# Patient Record
Sex: Male | Born: 1960
Health system: Southern US, Community
[De-identification: ages and names within clinical notes are randomized; demographics above are authoritative.]

## PROBLEM LIST (undated history)

## (undated) DIAGNOSIS — Z9889 Other specified postprocedural states: Secondary | ICD-10-CM

## (undated) DIAGNOSIS — J449 Chronic obstructive pulmonary disease, unspecified: Secondary | ICD-10-CM

## (undated) DIAGNOSIS — I10 Essential (primary) hypertension: Secondary | ICD-10-CM

## (undated) DIAGNOSIS — E785 Hyperlipidemia, unspecified: Secondary | ICD-10-CM

## (undated) DIAGNOSIS — E119 Type 2 diabetes mellitus without complications: Secondary | ICD-10-CM

## (undated) DIAGNOSIS — G4733 Obstructive sleep apnea (adult) (pediatric): Secondary | ICD-10-CM

## (undated) HISTORY — PX: NECK SURGERY: SHX720

## (undated) HISTORY — PX: BACK SURGERY: SHX140

## (undated) HISTORY — DX: Other specified postprocedural states: Z98.890

## (undated) HISTORY — DX: Hyperlipidemia, unspecified: E78.5

## (undated) HISTORY — DX: Obstructive sleep apnea (adult) (pediatric): G47.33

## (undated) HISTORY — PX: NASAL SINUS SURGERY: SHX719

## (undated) SURGICAL SUPPLY — 9 items
CATH IMPULSE 5F ANG/FL3.5 (CATHETERS) ×1
DEVICE RAD COMP TR BAND LRG (VASCULAR PRODUCTS) ×1
GLIDESHEATH SLEND SS 6F .021 (SHEATH) ×2 IMPLANT
INQWIRE 1.5J .035X260CM (WIRE) ×2
KIT HEART LEFT (KITS) ×2 IMPLANT
PACK CARDIAC CATHETERIZATION (CUSTOM PROCEDURE TRAY) ×1
SYR MEDRAD MARK V 150ML (SYRINGE) ×1
TRANSDUCER W/STOPCOCK (MISCELLANEOUS) ×1
TUBING CIL FLEX 10 FLL-RA (TUBING) ×1

---

## 2010-04-26 DIAGNOSIS — K2270 Barrett's esophagus without dysplasia: Secondary | ICD-10-CM

## 2010-04-26 DIAGNOSIS — E109 Type 1 diabetes mellitus without complications: Secondary | ICD-10-CM

## 2010-04-26 DIAGNOSIS — K219 Gastro-esophageal reflux disease without esophagitis: Secondary | ICD-10-CM

## 2010-05-25 DIAGNOSIS — K219 Gastro-esophageal reflux disease without esophagitis: Secondary | ICD-10-CM

## 2010-05-25 DIAGNOSIS — K644 Residual hemorrhoidal skin tags: Secondary | ICD-10-CM

## 2010-05-25 DIAGNOSIS — Z79899 Other long term (current) drug therapy: Secondary | ICD-10-CM | POA: Insufficient documentation

## 2010-05-25 DIAGNOSIS — Z1211 Encounter for screening for malignant neoplasm of colon: Secondary | ICD-10-CM

## 2010-05-25 DIAGNOSIS — E119 Type 2 diabetes mellitus without complications: Secondary | ICD-10-CM | POA: Insufficient documentation

## 2010-05-25 DIAGNOSIS — K2270 Barrett's esophagus without dysplasia: Secondary | ICD-10-CM | POA: Insufficient documentation

## 2010-05-25 DIAGNOSIS — K2960 Other gastritis without bleeding: Secondary | ICD-10-CM

## 2010-05-25 DIAGNOSIS — E785 Hyperlipidemia, unspecified: Secondary | ICD-10-CM | POA: Insufficient documentation

## 2010-05-25 DIAGNOSIS — I10 Essential (primary) hypertension: Secondary | ICD-10-CM | POA: Insufficient documentation

## 2011-06-20 DIAGNOSIS — N649 Disorder of breast, unspecified: Secondary | ICD-10-CM

## 2011-06-20 DIAGNOSIS — N644 Mastodynia: Secondary | ICD-10-CM

## 2011-07-04 DIAGNOSIS — N649 Disorder of breast, unspecified: Secondary | ICD-10-CM

## 2011-07-04 DIAGNOSIS — N644 Mastodynia: Secondary | ICD-10-CM | POA: Insufficient documentation

## 2012-06-20 DIAGNOSIS — R059 Cough, unspecified: Secondary | ICD-10-CM

## 2012-06-20 DIAGNOSIS — Z0000 Encounter for general adult medical examination without abnormal findings: Secondary | ICD-10-CM

## 2012-06-20 DIAGNOSIS — R05 Cough: Secondary | ICD-10-CM

## 2012-06-20 DIAGNOSIS — I10 Essential (primary) hypertension: Secondary | ICD-10-CM

## 2012-06-20 DIAGNOSIS — E785 Hyperlipidemia, unspecified: Secondary | ICD-10-CM

## 2012-06-20 DIAGNOSIS — K219 Gastro-esophageal reflux disease without esophagitis: Secondary | ICD-10-CM

## 2012-06-20 HISTORY — DX: Essential (primary) hypertension: I10

## 2012-06-20 HISTORY — DX: Type 2 diabetes mellitus without complications: E11.9

## 2014-01-30 DIAGNOSIS — S29001A Unspecified injury of muscle and tendon of front wall of thorax, initial encounter: Secondary | ICD-10-CM | POA: Diagnosis not present

## 2014-01-30 DIAGNOSIS — Z7951 Long term (current) use of inhaled steroids: Secondary | ICD-10-CM | POA: Diagnosis not present

## 2014-01-30 DIAGNOSIS — Y92410 Unspecified street and highway as the place of occurrence of the external cause: Secondary | ICD-10-CM | POA: Diagnosis not present

## 2014-01-30 DIAGNOSIS — Z880 Allergy status to penicillin: Secondary | ICD-10-CM | POA: Diagnosis not present

## 2014-01-30 DIAGNOSIS — I10 Essential (primary) hypertension: Secondary | ICD-10-CM | POA: Insufficient documentation

## 2014-01-30 DIAGNOSIS — E785 Hyperlipidemia, unspecified: Secondary | ICD-10-CM | POA: Diagnosis not present

## 2014-01-30 DIAGNOSIS — Z7982 Long term (current) use of aspirin: Secondary | ICD-10-CM | POA: Diagnosis not present

## 2014-01-30 DIAGNOSIS — Z87891 Personal history of nicotine dependence: Secondary | ICD-10-CM | POA: Diagnosis not present

## 2014-01-30 DIAGNOSIS — Z79899 Other long term (current) drug therapy: Secondary | ICD-10-CM | POA: Diagnosis not present

## 2014-01-30 DIAGNOSIS — G4733 Obstructive sleep apnea (adult) (pediatric): Secondary | ICD-10-CM | POA: Diagnosis not present

## 2014-01-30 DIAGNOSIS — E119 Type 2 diabetes mellitus without complications: Secondary | ICD-10-CM | POA: Diagnosis not present

## 2014-01-30 DIAGNOSIS — S4991XA Unspecified injury of right shoulder and upper arm, initial encounter: Secondary | ICD-10-CM | POA: Insufficient documentation

## 2014-01-30 DIAGNOSIS — Y998 Other external cause status: Secondary | ICD-10-CM | POA: Diagnosis not present

## 2014-01-30 DIAGNOSIS — Z9981 Dependence on supplemental oxygen: Secondary | ICD-10-CM | POA: Diagnosis not present

## 2014-01-30 DIAGNOSIS — T148XXA Other injury of unspecified body region, initial encounter: Secondary | ICD-10-CM

## 2014-01-30 DIAGNOSIS — Y9389 Activity, other specified: Secondary | ICD-10-CM | POA: Diagnosis not present

## 2014-02-23 DIAGNOSIS — R072 Precordial pain: Secondary | ICD-10-CM

## 2014-02-24 DIAGNOSIS — I10 Essential (primary) hypertension: Secondary | ICD-10-CM | POA: Diagnosis not present

## 2014-02-24 DIAGNOSIS — R0602 Shortness of breath: Secondary | ICD-10-CM | POA: Insufficient documentation

## 2014-02-24 DIAGNOSIS — R072 Precordial pain: Secondary | ICD-10-CM | POA: Diagnosis present

## 2014-02-24 DIAGNOSIS — E119 Type 2 diabetes mellitus without complications: Secondary | ICD-10-CM | POA: Insufficient documentation

## 2014-02-24 DIAGNOSIS — Z87891 Personal history of nicotine dependence: Secondary | ICD-10-CM | POA: Insufficient documentation

## 2014-03-30 VITALS — BP 126/74 | HR 75 | Ht 74.0 in | Wt 272.0 lb

## 2014-03-30 DIAGNOSIS — Z136 Encounter for screening for cardiovascular disorders: Secondary | ICD-10-CM

## 2014-03-30 DIAGNOSIS — R0600 Dyspnea, unspecified: Secondary | ICD-10-CM

## 2014-03-30 DIAGNOSIS — R0609 Other forms of dyspnea: Secondary | ICD-10-CM

## 2014-03-31 DIAGNOSIS — R0609 Other forms of dyspnea: Principal | ICD-10-CM

## 2014-03-31 DIAGNOSIS — R0600 Dyspnea, unspecified: Secondary | ICD-10-CM | POA: Insufficient documentation

## 2014-04-09 DIAGNOSIS — R0600 Dyspnea, unspecified: Secondary | ICD-10-CM | POA: Diagnosis not present

## 2014-04-09 DIAGNOSIS — E119 Type 2 diabetes mellitus without complications: Secondary | ICD-10-CM | POA: Diagnosis not present

## 2014-04-09 DIAGNOSIS — E785 Hyperlipidemia, unspecified: Secondary | ICD-10-CM | POA: Insufficient documentation

## 2014-04-09 DIAGNOSIS — Z87891 Personal history of nicotine dependence: Secondary | ICD-10-CM | POA: Diagnosis not present

## 2014-04-09 DIAGNOSIS — I10 Essential (primary) hypertension: Secondary | ICD-10-CM | POA: Diagnosis not present

## 2014-04-09 DIAGNOSIS — R0609 Other forms of dyspnea: Secondary | ICD-10-CM

## 2014-04-12 DIAGNOSIS — R0609 Other forms of dyspnea: Principal | ICD-10-CM

## 2014-04-12 DIAGNOSIS — R0600 Dyspnea, unspecified: Secondary | ICD-10-CM

## 2014-04-17 DIAGNOSIS — R0609 Other forms of dyspnea: Secondary | ICD-10-CM

## 2014-04-19 DIAGNOSIS — I10 Essential (primary) hypertension: Secondary | ICD-10-CM | POA: Insufficient documentation

## 2014-04-19 DIAGNOSIS — E119 Type 2 diabetes mellitus without complications: Secondary | ICD-10-CM | POA: Diagnosis not present

## 2014-04-19 DIAGNOSIS — R0600 Dyspnea, unspecified: Secondary | ICD-10-CM

## 2014-04-19 DIAGNOSIS — G4733 Obstructive sleep apnea (adult) (pediatric): Secondary | ICD-10-CM | POA: Insufficient documentation

## 2014-04-19 DIAGNOSIS — E785 Hyperlipidemia, unspecified: Secondary | ICD-10-CM | POA: Insufficient documentation

## 2014-04-19 DIAGNOSIS — R0609 Other forms of dyspnea: Secondary | ICD-10-CM | POA: Diagnosis not present

## 2014-04-19 MED ADMIN — Sodium Chloride Inj 0.9%: 10 mL | INTRAVENOUS

## 2014-04-19 MED ADMIN — TECHNETIUM TC 99M SESTAMIBI GENERIC - CARDIOLITE: 10 | INTRAVENOUS | NDC 99999080074

## 2014-04-19 MED ADMIN — Technetium Tc 99m Sestamibi IV for Soln Kit: 30 | INTRAVENOUS | @ 11:00:00 | NDC 99999080005

## 2014-04-19 MED ADMIN — Sodium Chloride Inj 0.9%: 10 mL | INTRAVENOUS | NDC 8881570121

## 2014-05-03 VITALS — BP 119/66 | HR 60 | Resp 12

## 2014-05-03 DIAGNOSIS — R52 Pain, unspecified: Secondary | ICD-10-CM

## 2014-05-03 DIAGNOSIS — S86012A Strain of left Achilles tendon, initial encounter: Secondary | ICD-10-CM

## 2014-05-06 VITALS — BP 142/70 | HR 62 | Ht 74.0 in | Wt 273.0 lb

## 2014-05-06 DIAGNOSIS — R0609 Other forms of dyspnea: Secondary | ICD-10-CM

## 2014-05-06 DIAGNOSIS — R0600 Dyspnea, unspecified: Secondary | ICD-10-CM

## 2014-05-21 DIAGNOSIS — R0609 Other forms of dyspnea: Secondary | ICD-10-CM | POA: Insufficient documentation

## 2014-05-21 DIAGNOSIS — R0600 Dyspnea, unspecified: Secondary | ICD-10-CM

## 2014-05-21 DIAGNOSIS — F17210 Nicotine dependence, cigarettes, uncomplicated: Secondary | ICD-10-CM | POA: Insufficient documentation

## 2014-05-21 MED ADMIN — Albuterol Sulfate Soln Nebu 0.083% (2.5 MG/3ML): 2.5 mg | RESPIRATORY_TRACT | NDC 00487950101

## 2014-05-25 DIAGNOSIS — R0600 Dyspnea, unspecified: Secondary | ICD-10-CM

## 2014-05-25 DIAGNOSIS — R0609 Other forms of dyspnea: Principal | ICD-10-CM

## 2014-05-31 VITALS — BP 134/57 | HR 78 | Resp 16

## 2014-05-31 DIAGNOSIS — S86012D Strain of left Achilles tendon, subsequent encounter: Secondary | ICD-10-CM

## 2014-06-04 VITALS — BP 144/70 | HR 71 | Ht 74.0 in

## 2014-06-04 DIAGNOSIS — R0789 Other chest pain: Secondary | ICD-10-CM | POA: Diagnosis not present

## 2014-06-04 DIAGNOSIS — R0600 Dyspnea, unspecified: Secondary | ICD-10-CM | POA: Diagnosis not present

## 2014-06-04 DIAGNOSIS — I10 Essential (primary) hypertension: Secondary | ICD-10-CM | POA: Diagnosis not present

## 2014-11-24 VITALS — BP 92/50 | HR 70 | Resp 18

## 2014-11-24 DIAGNOSIS — S86012D Strain of left Achilles tendon, subsequent encounter: Secondary | ICD-10-CM

## 2014-12-04 DIAGNOSIS — S86012D Strain of left Achilles tendon, subsequent encounter: Secondary | ICD-10-CM

## 2014-12-20 DIAGNOSIS — S86012D Strain of left Achilles tendon, subsequent encounter: Secondary | ICD-10-CM

## 2015-02-17 VITALS — BP 138/52 | HR 79 | Ht 73.0 in | Wt 264.0 lb

## 2015-02-17 DIAGNOSIS — I10 Essential (primary) hypertension: Secondary | ICD-10-CM

## 2015-06-30 DIAGNOSIS — Z1389 Encounter for screening for other disorder: Secondary | ICD-10-CM | POA: Diagnosis not present

## 2015-06-30 DIAGNOSIS — H6990 Unspecified Eustachian tube disorder, unspecified ear: Secondary | ICD-10-CM | POA: Diagnosis not present

## 2015-06-30 DIAGNOSIS — Z6833 Body mass index (BMI) 33.0-33.9, adult: Secondary | ICD-10-CM | POA: Diagnosis not present

## 2015-07-29 DIAGNOSIS — Z6833 Body mass index (BMI) 33.0-33.9, adult: Secondary | ICD-10-CM | POA: Diagnosis not present

## 2015-07-29 DIAGNOSIS — E6609 Other obesity due to excess calories: Secondary | ICD-10-CM | POA: Diagnosis not present

## 2015-07-29 DIAGNOSIS — Z0001 Encounter for general adult medical examination with abnormal findings: Secondary | ICD-10-CM | POA: Diagnosis not present

## 2015-07-29 DIAGNOSIS — Z1389 Encounter for screening for other disorder: Secondary | ICD-10-CM | POA: Diagnosis not present

## 2015-07-29 DIAGNOSIS — K219 Gastro-esophageal reflux disease without esophagitis: Secondary | ICD-10-CM | POA: Diagnosis not present

## 2015-07-29 DIAGNOSIS — Z6839 Body mass index (BMI) 39.0-39.9, adult: Secondary | ICD-10-CM | POA: Diagnosis not present

## 2015-09-01 DIAGNOSIS — G4733 Obstructive sleep apnea (adult) (pediatric): Secondary | ICD-10-CM | POA: Diagnosis not present

## 2015-10-04 DIAGNOSIS — Z1389 Encounter for screening for other disorder: Secondary | ICD-10-CM | POA: Diagnosis not present

## 2015-10-04 DIAGNOSIS — K219 Gastro-esophageal reflux disease without esophagitis: Secondary | ICD-10-CM | POA: Diagnosis not present

## 2015-10-04 DIAGNOSIS — Z6834 Body mass index (BMI) 34.0-34.9, adult: Secondary | ICD-10-CM | POA: Diagnosis not present

## 2015-10-04 DIAGNOSIS — E119 Type 2 diabetes mellitus without complications: Secondary | ICD-10-CM | POA: Diagnosis not present

## 2015-10-04 DIAGNOSIS — I10 Essential (primary) hypertension: Secondary | ICD-10-CM | POA: Diagnosis not present

## 2015-10-04 DIAGNOSIS — J0100 Acute maxillary sinusitis, unspecified: Secondary | ICD-10-CM | POA: Diagnosis not present

## 2015-11-18 DIAGNOSIS — G4733 Obstructive sleep apnea (adult) (pediatric): Secondary | ICD-10-CM | POA: Diagnosis not present

## 2015-12-21 DIAGNOSIS — Z23 Encounter for immunization: Secondary | ICD-10-CM | POA: Diagnosis not present

## 2015-12-22 DIAGNOSIS — G4733 Obstructive sleep apnea (adult) (pediatric): Secondary | ICD-10-CM | POA: Diagnosis not present

## 2016-01-31 DIAGNOSIS — R0602 Shortness of breath: Secondary | ICD-10-CM | POA: Diagnosis not present

## 2016-01-31 DIAGNOSIS — Z79899 Other long term (current) drug therapy: Secondary | ICD-10-CM | POA: Insufficient documentation

## 2016-01-31 DIAGNOSIS — R110 Nausea: Secondary | ICD-10-CM | POA: Insufficient documentation

## 2016-01-31 DIAGNOSIS — Z7982 Long term (current) use of aspirin: Secondary | ICD-10-CM | POA: Insufficient documentation

## 2016-01-31 DIAGNOSIS — R0789 Other chest pain: Secondary | ICD-10-CM | POA: Diagnosis not present

## 2016-01-31 DIAGNOSIS — I10 Essential (primary) hypertension: Secondary | ICD-10-CM | POA: Insufficient documentation

## 2016-01-31 DIAGNOSIS — Z6834 Body mass index (BMI) 34.0-34.9, adult: Secondary | ICD-10-CM | POA: Diagnosis not present

## 2016-01-31 DIAGNOSIS — Z1389 Encounter for screening for other disorder: Secondary | ICD-10-CM | POA: Diagnosis not present

## 2016-01-31 DIAGNOSIS — R079 Chest pain, unspecified: Secondary | ICD-10-CM | POA: Insufficient documentation

## 2016-01-31 DIAGNOSIS — M542 Cervicalgia: Secondary | ICD-10-CM | POA: Insufficient documentation

## 2016-01-31 DIAGNOSIS — Z87891 Personal history of nicotine dependence: Secondary | ICD-10-CM | POA: Insufficient documentation

## 2016-01-31 DIAGNOSIS — N400 Enlarged prostate without lower urinary tract symptoms: Secondary | ICD-10-CM | POA: Diagnosis not present

## 2016-01-31 DIAGNOSIS — J449 Chronic obstructive pulmonary disease, unspecified: Secondary | ICD-10-CM | POA: Insufficient documentation

## 2016-01-31 DIAGNOSIS — R200 Anesthesia of skin: Secondary | ICD-10-CM | POA: Insufficient documentation

## 2016-01-31 DIAGNOSIS — E119 Type 2 diabetes mellitus without complications: Secondary | ICD-10-CM | POA: Insufficient documentation

## 2016-01-31 HISTORY — DX: Chronic obstructive pulmonary disease, unspecified: J44.9

## 2016-02-28 DIAGNOSIS — M47812 Spondylosis without myelopathy or radiculopathy, cervical region: Secondary | ICD-10-CM | POA: Insufficient documentation

## 2016-02-28 DIAGNOSIS — G8929 Other chronic pain: Secondary | ICD-10-CM

## 2016-02-28 DIAGNOSIS — M25511 Pain in right shoulder: Principal | ICD-10-CM

## 2016-02-28 DIAGNOSIS — M5410 Radiculopathy, site unspecified: Secondary | ICD-10-CM | POA: Diagnosis not present

## 2016-02-28 DIAGNOSIS — M542 Cervicalgia: Secondary | ICD-10-CM | POA: Diagnosis not present

## 2016-02-28 DIAGNOSIS — Z1389 Encounter for screening for other disorder: Secondary | ICD-10-CM | POA: Diagnosis not present

## 2016-02-28 DIAGNOSIS — Z6837 Body mass index (BMI) 37.0-37.9, adult: Secondary | ICD-10-CM | POA: Diagnosis not present

## 2016-03-07 VITALS — BP 126/58 | HR 59 | Ht 74.0 in | Wt 278.0 lb

## 2016-03-07 DIAGNOSIS — R0602 Shortness of breath: Secondary | ICD-10-CM

## 2016-03-07 DIAGNOSIS — G4733 Obstructive sleep apnea (adult) (pediatric): Secondary | ICD-10-CM | POA: Diagnosis not present

## 2016-03-07 DIAGNOSIS — R0789 Other chest pain: Secondary | ICD-10-CM | POA: Diagnosis not present

## 2016-03-16 DIAGNOSIS — R072 Precordial pain: Secondary | ICD-10-CM | POA: Insufficient documentation

## 2016-03-16 DIAGNOSIS — Z01818 Encounter for other preprocedural examination: Secondary | ICD-10-CM

## 2016-03-19 DIAGNOSIS — R0789 Other chest pain: Secondary | ICD-10-CM

## 2016-03-23 DIAGNOSIS — Z7982 Long term (current) use of aspirin: Secondary | ICD-10-CM | POA: Insufficient documentation

## 2016-03-23 DIAGNOSIS — G4733 Obstructive sleep apnea (adult) (pediatric): Secondary | ICD-10-CM | POA: Insufficient documentation

## 2016-03-23 DIAGNOSIS — Z833 Family history of diabetes mellitus: Secondary | ICD-10-CM | POA: Insufficient documentation

## 2016-03-23 DIAGNOSIS — E119 Type 2 diabetes mellitus without complications: Secondary | ICD-10-CM | POA: Diagnosis not present

## 2016-03-23 DIAGNOSIS — I10 Essential (primary) hypertension: Secondary | ICD-10-CM | POA: Diagnosis present

## 2016-03-23 DIAGNOSIS — I4510 Unspecified right bundle-branch block: Secondary | ICD-10-CM | POA: Diagnosis not present

## 2016-03-23 DIAGNOSIS — Z7951 Long term (current) use of inhaled steroids: Secondary | ICD-10-CM | POA: Diagnosis not present

## 2016-03-23 DIAGNOSIS — Z8249 Family history of ischemic heart disease and other diseases of the circulatory system: Secondary | ICD-10-CM | POA: Insufficient documentation

## 2016-03-23 DIAGNOSIS — Z880 Allergy status to penicillin: Secondary | ICD-10-CM | POA: Insufficient documentation

## 2016-03-23 DIAGNOSIS — I2510 Atherosclerotic heart disease of native coronary artery without angina pectoris: Secondary | ICD-10-CM | POA: Insufficient documentation

## 2016-03-23 DIAGNOSIS — R9439 Abnormal result of other cardiovascular function study: Secondary | ICD-10-CM | POA: Diagnosis present

## 2016-03-23 DIAGNOSIS — R0609 Other forms of dyspnea: Secondary | ICD-10-CM | POA: Diagnosis present

## 2016-03-23 DIAGNOSIS — Z87891 Personal history of nicotine dependence: Secondary | ICD-10-CM | POA: Diagnosis not present

## 2016-03-23 DIAGNOSIS — E785 Hyperlipidemia, unspecified: Secondary | ICD-10-CM | POA: Diagnosis not present

## 2016-03-23 DIAGNOSIS — R0600 Dyspnea, unspecified: Secondary | ICD-10-CM | POA: Diagnosis present

## 2016-03-23 DIAGNOSIS — J449 Chronic obstructive pulmonary disease, unspecified: Secondary | ICD-10-CM | POA: Insufficient documentation

## 2016-03-23 DIAGNOSIS — R0789 Other chest pain: Secondary | ICD-10-CM

## 2016-03-23 HISTORY — PX: CARDIAC CATHETERIZATION: SHX172

## 2016-03-23 MED ADMIN — RADIAL COCKTAIL/VERAPAMIL ONLY: 10 mL | INTRA_ARTERIAL | NDC 00409114405

## 2016-03-23 MED ADMIN — Midazolam HCl Inj 2 MG/2ML (Base Equivalent): 1 mg | INTRAVENOUS | NDC 00409230521

## 2016-03-23 MED ADMIN — Heparin Sodium (Porcine) 2 Unit/ML in Sodium Chloride 0.9%: 1000 mL | NDC 00409762003

## 2016-03-23 MED ADMIN — Iopamidol IV Soln 76%: 100 mL | INTRA_ARTERIAL | NDC 00270131635

## 2016-03-23 MED ADMIN — Heparin Sodium (Porcine) Inj 1000 Unit/ML: 6000 [IU] | INTRAVENOUS | NDC 63323054011

## 2016-03-23 MED ADMIN — Lidocaine HCl Local Preservative Free (PF) Inj 1%: 2 mL | SUBCUTANEOUS | NDC 00409427902

## 2016-03-23 MED ADMIN — Fentanyl Citrate Preservative Free (PF) Inj 100 MCG/2ML: 25 ug | INTRAVENOUS | NDC 00409909332

## 2016-03-23 MED ADMIN — Sodium Chloride IV Soln 0.9%: 3 mL/kg/h | INTRAVENOUS | NDC 00338954306

## 2016-04-09 VITALS — BP 146/60 | HR 68 | Ht 74.0 in | Wt 278.0 lb

## 2016-04-09 DIAGNOSIS — R0789 Other chest pain: Secondary | ICD-10-CM | POA: Diagnosis not present

## 2016-04-09 DIAGNOSIS — R0600 Dyspnea, unspecified: Secondary | ICD-10-CM | POA: Diagnosis not present

## 2016-05-04 DIAGNOSIS — M542 Cervicalgia: Secondary | ICD-10-CM | POA: Diagnosis not present

## 2016-05-04 DIAGNOSIS — M5030 Other cervical disc degeneration, unspecified cervical region: Secondary | ICD-10-CM | POA: Diagnosis not present

## 2016-05-04 DIAGNOSIS — M4722 Other spondylosis with radiculopathy, cervical region: Secondary | ICD-10-CM | POA: Diagnosis not present

## 2016-05-04 DIAGNOSIS — M5412 Radiculopathy, cervical region: Secondary | ICD-10-CM | POA: Diagnosis not present

## 2016-05-24 DIAGNOSIS — L918 Other hypertrophic disorders of the skin: Secondary | ICD-10-CM | POA: Diagnosis not present

## 2016-05-24 DIAGNOSIS — B078 Other viral warts: Secondary | ICD-10-CM | POA: Diagnosis not present

## 2016-07-05 DIAGNOSIS — G4733 Obstructive sleep apnea (adult) (pediatric): Secondary | ICD-10-CM | POA: Diagnosis not present

## 2016-07-16 DIAGNOSIS — Z6836 Body mass index (BMI) 36.0-36.9, adult: Secondary | ICD-10-CM | POA: Diagnosis not present

## 2016-07-16 DIAGNOSIS — Z1389 Encounter for screening for other disorder: Secondary | ICD-10-CM | POA: Diagnosis not present

## 2016-07-16 DIAGNOSIS — E119 Type 2 diabetes mellitus without complications: Secondary | ICD-10-CM | POA: Diagnosis not present

## 2016-07-16 DIAGNOSIS — F419 Anxiety disorder, unspecified: Secondary | ICD-10-CM | POA: Diagnosis not present

## 2016-08-17 DIAGNOSIS — Z6836 Body mass index (BMI) 36.0-36.9, adult: Secondary | ICD-10-CM | POA: Diagnosis not present

## 2016-08-17 DIAGNOSIS — J302 Other seasonal allergic rhinitis: Secondary | ICD-10-CM | POA: Diagnosis not present

## 2016-08-17 DIAGNOSIS — E782 Mixed hyperlipidemia: Secondary | ICD-10-CM | POA: Diagnosis not present

## 2016-08-17 DIAGNOSIS — T7840XA Allergy, unspecified, initial encounter: Secondary | ICD-10-CM | POA: Diagnosis not present

## 2016-08-17 DIAGNOSIS — L209 Atopic dermatitis, unspecified: Secondary | ICD-10-CM | POA: Diagnosis not present

## 2016-08-17 DIAGNOSIS — E119 Type 2 diabetes mellitus without complications: Secondary | ICD-10-CM | POA: Diagnosis not present

## 2016-08-17 DIAGNOSIS — Z1389 Encounter for screening for other disorder: Secondary | ICD-10-CM | POA: Diagnosis not present

## 2016-10-02 DIAGNOSIS — G4733 Obstructive sleep apnea (adult) (pediatric): Secondary | ICD-10-CM | POA: Diagnosis not present

## 2016-12-14 DIAGNOSIS — Z0001 Encounter for general adult medical examination with abnormal findings: Secondary | ICD-10-CM | POA: Diagnosis not present

## 2016-12-14 DIAGNOSIS — I10 Essential (primary) hypertension: Secondary | ICD-10-CM | POA: Diagnosis not present

## 2016-12-14 DIAGNOSIS — Z23 Encounter for immunization: Secondary | ICD-10-CM | POA: Diagnosis not present

## 2016-12-14 DIAGNOSIS — J301 Allergic rhinitis due to pollen: Secondary | ICD-10-CM | POA: Diagnosis not present

## 2016-12-14 DIAGNOSIS — E782 Mixed hyperlipidemia: Secondary | ICD-10-CM | POA: Diagnosis not present

## 2016-12-14 DIAGNOSIS — Z6835 Body mass index (BMI) 35.0-35.9, adult: Secondary | ICD-10-CM | POA: Diagnosis not present

## 2016-12-14 DIAGNOSIS — Z1389 Encounter for screening for other disorder: Secondary | ICD-10-CM | POA: Diagnosis not present

## 2016-12-14 DIAGNOSIS — E119 Type 2 diabetes mellitus without complications: Secondary | ICD-10-CM | POA: Diagnosis not present

## 2017-02-08 DIAGNOSIS — G4733 Obstructive sleep apnea (adult) (pediatric): Secondary | ICD-10-CM | POA: Diagnosis not present

## 2017-04-04 DIAGNOSIS — F341 Dysthymic disorder: Secondary | ICD-10-CM | POA: Diagnosis not present

## 2017-04-04 DIAGNOSIS — Z1389 Encounter for screening for other disorder: Secondary | ICD-10-CM | POA: Diagnosis not present

## 2017-04-04 DIAGNOSIS — R3912 Poor urinary stream: Secondary | ICD-10-CM | POA: Diagnosis not present

## 2017-04-04 DIAGNOSIS — R39198 Other difficulties with micturition: Secondary | ICD-10-CM | POA: Diagnosis not present

## 2017-04-04 DIAGNOSIS — J302 Other seasonal allergic rhinitis: Secondary | ICD-10-CM | POA: Diagnosis not present

## 2017-04-04 DIAGNOSIS — I509 Heart failure, unspecified: Secondary | ICD-10-CM | POA: Diagnosis not present

## 2017-04-04 DIAGNOSIS — Z6837 Body mass index (BMI) 37.0-37.9, adult: Secondary | ICD-10-CM | POA: Diagnosis not present

## 2017-04-04 DIAGNOSIS — I10 Essential (primary) hypertension: Secondary | ICD-10-CM | POA: Diagnosis not present

## 2017-05-16 DIAGNOSIS — G4733 Obstructive sleep apnea (adult) (pediatric): Secondary | ICD-10-CM | POA: Diagnosis not present

## 2017-08-13 DIAGNOSIS — W57XXXA Bitten or stung by nonvenomous insect and other nonvenomous arthropods, initial encounter: Secondary | ICD-10-CM | POA: Diagnosis not present

## 2017-08-13 DIAGNOSIS — Z1389 Encounter for screening for other disorder: Secondary | ICD-10-CM | POA: Diagnosis not present

## 2017-08-13 DIAGNOSIS — E11620 Type 2 diabetes mellitus with diabetic dermatitis: Secondary | ICD-10-CM | POA: Diagnosis not present

## 2017-08-13 DIAGNOSIS — L982 Febrile neutrophilic dermatosis [Sweet]: Secondary | ICD-10-CM | POA: Diagnosis not present

## 2017-08-13 DIAGNOSIS — Z6838 Body mass index (BMI) 38.0-38.9, adult: Secondary | ICD-10-CM | POA: Diagnosis not present

## 2017-09-05 DIAGNOSIS — G4733 Obstructive sleep apnea (adult) (pediatric): Secondary | ICD-10-CM | POA: Diagnosis not present

## 2017-09-05 IMAGING — MR MR ANKLE*L* W/O CM
4 of 5 series · 23 of 40 positions shown · non-contrast
Comparison: None.

CLINICAL DATA: Painful Achilles tendon. No recent injury. Initial
encounter.

EXAM:
MRI OF THE LEFT ANKLE WITHOUT CONTRAST
TECHNIQUE: Multiplanar, multisequence MR imaging of the ankle was performed. No
intravenous contrast was administered.

[Series 3: T1 · axial · 4.0mm · 0.27mm/px · z∈[-19,+145]mm · 4 of 40 slices shown (1 of 2)]
[im 1/40]
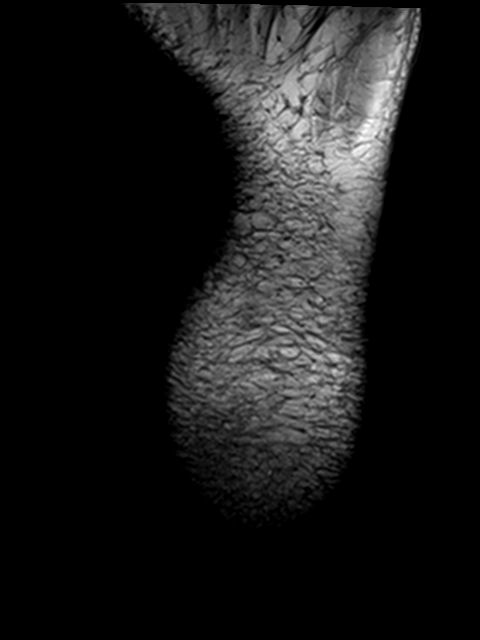
[im 5/40]
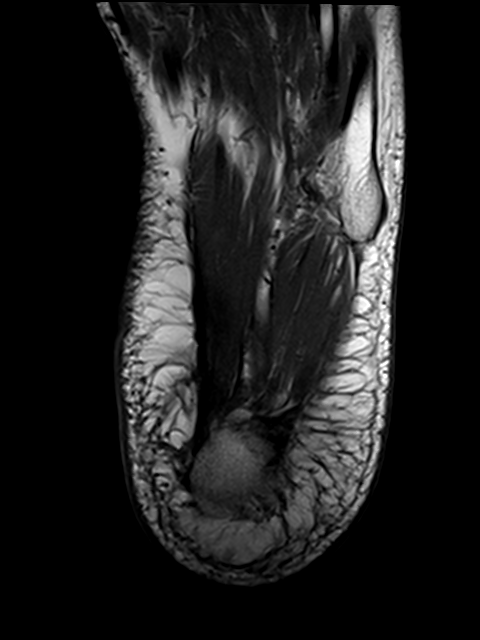
[im 22/40]
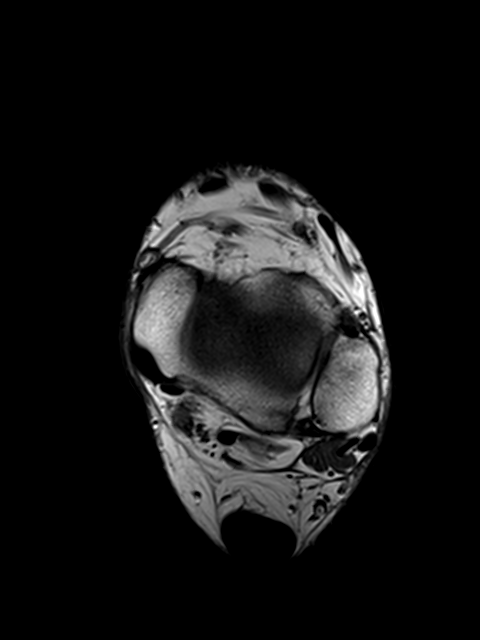
[im 35/40]
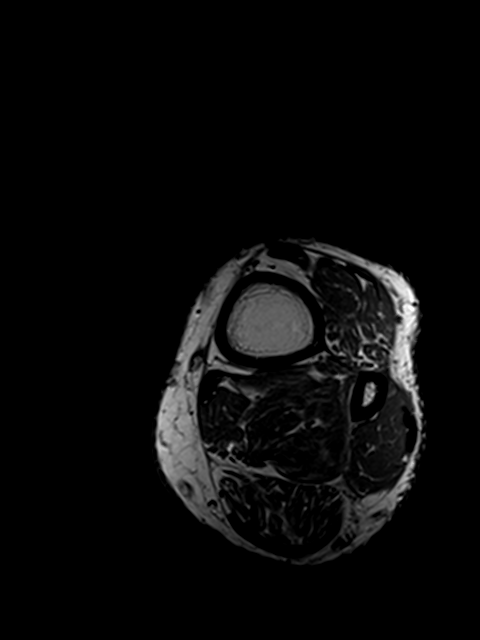

[Series 4: T1 · sagittal · 3.0mm · 0.31mm/px · 3 of 24 slices shown (2 of 2)]
[im 5/24]
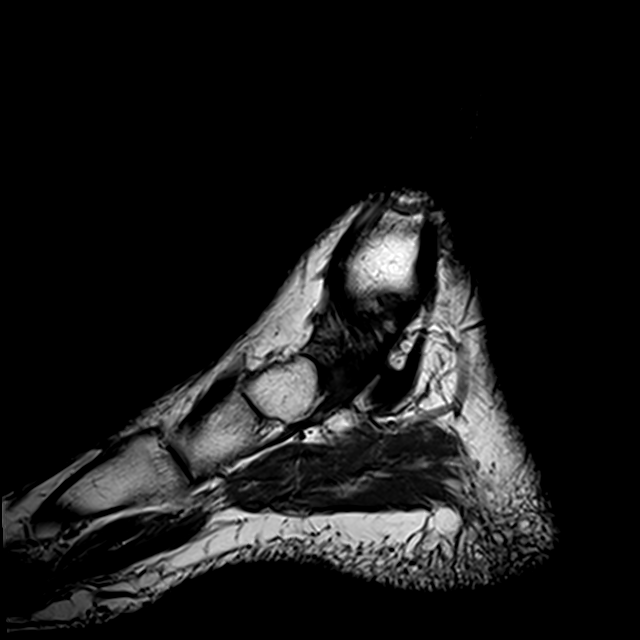
[im 14/24]
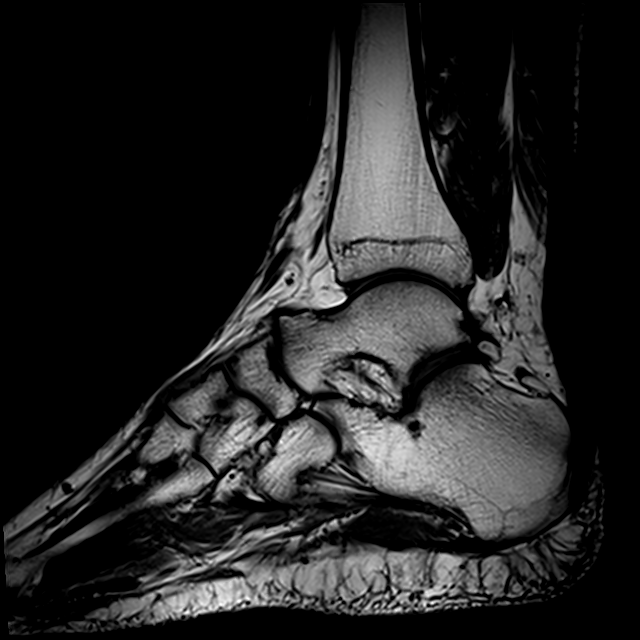
[im 24/24]
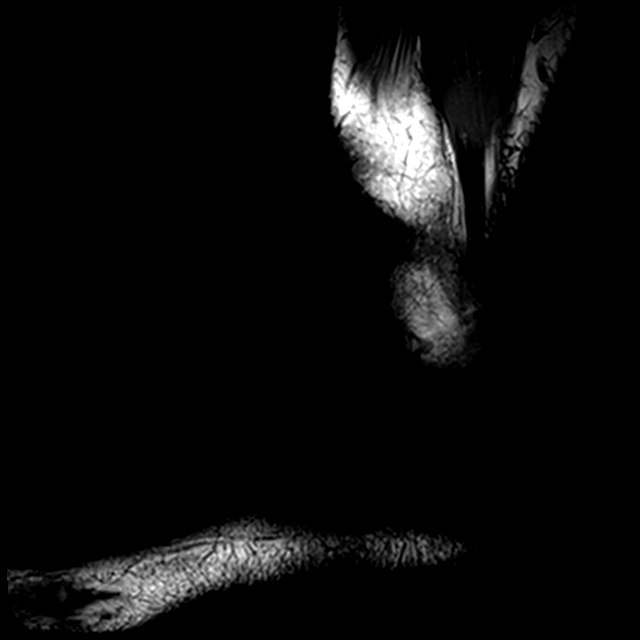

[Series 5: T2 fat-sat · axial · 4.0mm · 0.62mm/px · z∈[-19,+169]mm · 8 of 40 slices shown (1 of 2)]
[im 1/40]
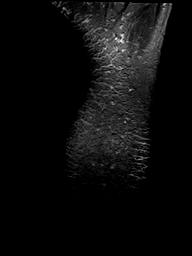
[im 5/40]
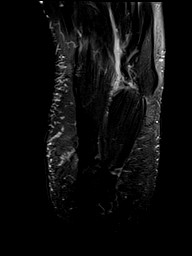
[im 14/40]
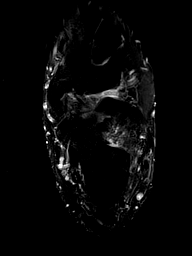
[im 18/40]
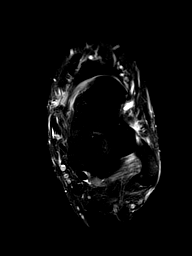
[im 22/40]
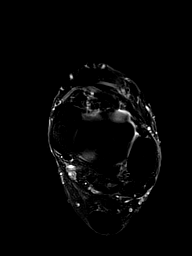
[im 27/40]
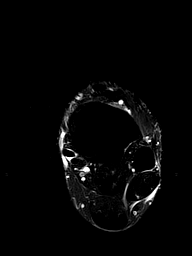
[im 35/40]
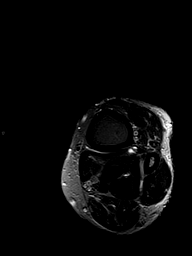
[im 40/40]
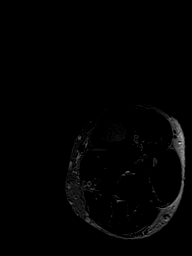

[Series 7: T2 fat-sat · coronal · 3.5mm · 0.43mm/px · 8 of 30 slices shown (2 of 2)]
[im 1/30]
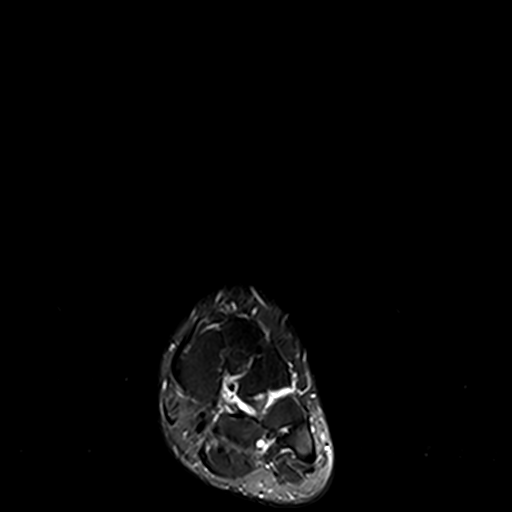
[im 5/30]
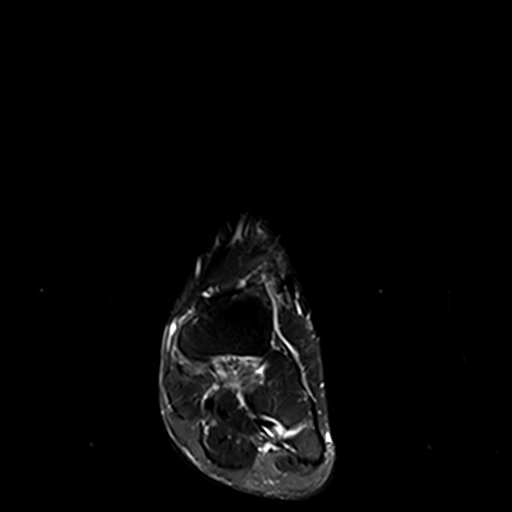
[im 9/30]
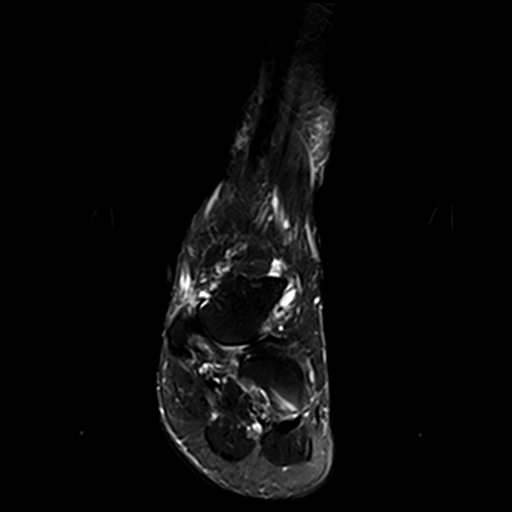
[im 13/30]
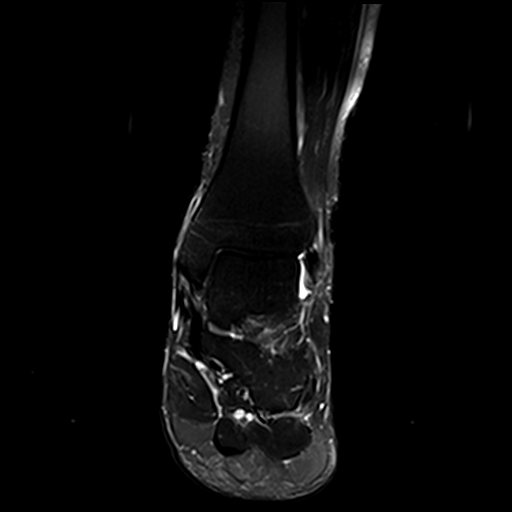
[im 17/30]
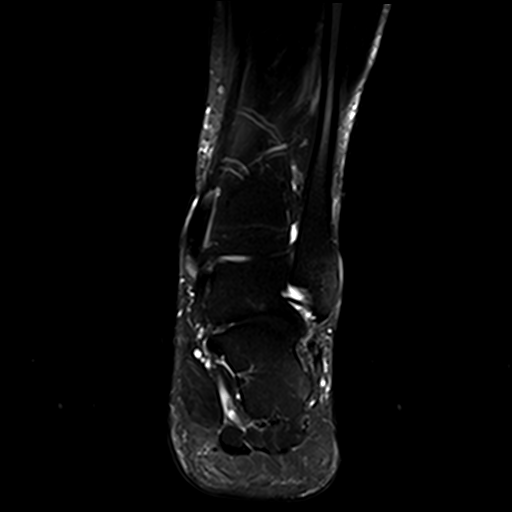
[im 21/30]
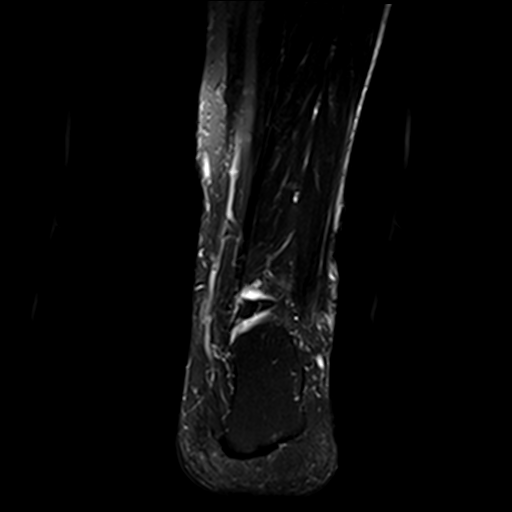
[im 25/30]
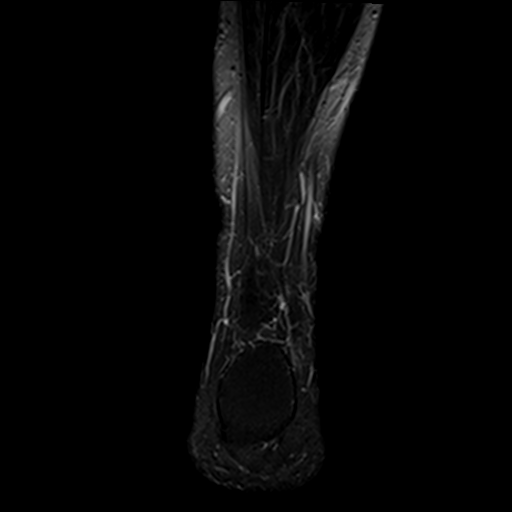
[im 30/30]
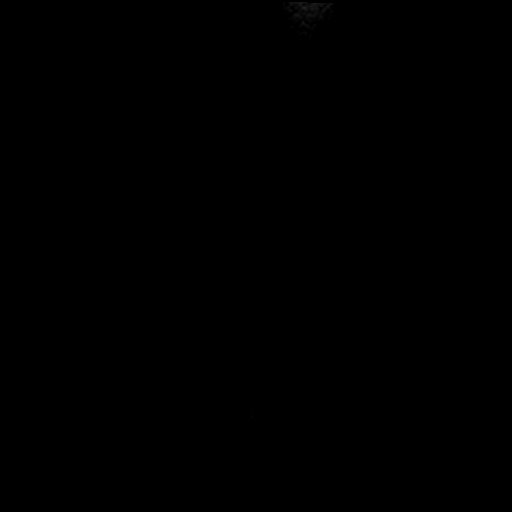

[23 of 40 positions shown; findings below may reference images not displayed]

FINDINGS: TENDONS

Peroneal: Intact.

Posteromedial: Intact.

Anterior: Intact.

Achilles: The Achilles tendon is thickened with a convex anterior
border. Only a very small focus of mildly increased T2 signal is
identified within the tendon. No tear. There is no Haglund's
deformity.

Plantar Fascia: Unremarkable.

LIGAMENTS

Lateral: Intact. The anterior talofibular ligament is attenuated
consistent with prior sprain and partial tear

Medial: Intact.

CARTILAGE

Ankle Joint: Small tibiotalar joint effusion is seen.

Subtalar Joints/Sinus Tarsi: Unremarkable.

Bones: Normal marrow signal throughout. Small plantar calcaneal spur
is noted.
IMPRESSION: Achilles tendinosis without tear.

Attenuated anterior talofibular ligament is consistent with prior
sprain and high-grade partial tear.

## 2017-11-25 DIAGNOSIS — G4733 Obstructive sleep apnea (adult) (pediatric): Secondary | ICD-10-CM | POA: Diagnosis not present

## 2017-11-27 DIAGNOSIS — Z6838 Body mass index (BMI) 38.0-38.9, adult: Secondary | ICD-10-CM | POA: Diagnosis not present

## 2017-11-27 DIAGNOSIS — R05 Cough: Secondary | ICD-10-CM | POA: Diagnosis not present

## 2017-11-27 DIAGNOSIS — R0602 Shortness of breath: Secondary | ICD-10-CM | POA: Diagnosis not present

## 2017-11-27 DIAGNOSIS — J343 Hypertrophy of nasal turbinates: Secondary | ICD-10-CM | POA: Diagnosis not present

## 2017-11-27 DIAGNOSIS — Z1389 Encounter for screening for other disorder: Secondary | ICD-10-CM | POA: Diagnosis not present

## 2017-11-27 DIAGNOSIS — Z23 Encounter for immunization: Secondary | ICD-10-CM | POA: Diagnosis not present

## 2017-12-20 DIAGNOSIS — Z23 Encounter for immunization: Secondary | ICD-10-CM | POA: Diagnosis not present

## 2018-01-02 DIAGNOSIS — Z6835 Body mass index (BMI) 35.0-35.9, adult: Secondary | ICD-10-CM | POA: Diagnosis not present

## 2018-01-02 DIAGNOSIS — I10 Essential (primary) hypertension: Secondary | ICD-10-CM | POA: Diagnosis not present

## 2018-01-02 DIAGNOSIS — N419 Inflammatory disease of prostate, unspecified: Secondary | ICD-10-CM | POA: Diagnosis not present

## 2018-01-02 DIAGNOSIS — E119 Type 2 diabetes mellitus without complications: Secondary | ICD-10-CM | POA: Diagnosis not present

## 2018-01-02 DIAGNOSIS — F341 Dysthymic disorder: Secondary | ICD-10-CM | POA: Diagnosis not present

## 2018-01-02 DIAGNOSIS — E782 Mixed hyperlipidemia: Secondary | ICD-10-CM | POA: Diagnosis not present

## 2018-01-02 DIAGNOSIS — Z1389 Encounter for screening for other disorder: Secondary | ICD-10-CM | POA: Diagnosis not present

## 2018-01-02 DIAGNOSIS — Z0001 Encounter for general adult medical examination with abnormal findings: Secondary | ICD-10-CM | POA: Diagnosis not present

## 2018-01-03 DIAGNOSIS — Z0001 Encounter for general adult medical examination with abnormal findings: Secondary | ICD-10-CM | POA: Diagnosis not present

## 2018-01-03 DIAGNOSIS — K219 Gastro-esophageal reflux disease without esophagitis: Secondary | ICD-10-CM | POA: Diagnosis not present

## 2018-01-03 DIAGNOSIS — Z1389 Encounter for screening for other disorder: Secondary | ICD-10-CM | POA: Diagnosis not present

## 2018-01-03 DIAGNOSIS — N419 Inflammatory disease of prostate, unspecified: Secondary | ICD-10-CM | POA: Diagnosis not present

## 2018-01-09 DIAGNOSIS — R7989 Other specified abnormal findings of blood chemistry: Secondary | ICD-10-CM | POA: Diagnosis not present

## 2018-02-10 DIAGNOSIS — R7989 Other specified abnormal findings of blood chemistry: Secondary | ICD-10-CM | POA: Diagnosis not present

## 2018-02-25 DIAGNOSIS — G4733 Obstructive sleep apnea (adult) (pediatric): Secondary | ICD-10-CM | POA: Diagnosis not present

## 2018-03-13 DIAGNOSIS — R7989 Other specified abnormal findings of blood chemistry: Secondary | ICD-10-CM | POA: Diagnosis not present

## 2018-04-11 DIAGNOSIS — R7989 Other specified abnormal findings of blood chemistry: Secondary | ICD-10-CM | POA: Diagnosis not present

## 2018-04-11 DIAGNOSIS — E291 Testicular hypofunction: Secondary | ICD-10-CM | POA: Diagnosis not present

## 2018-04-24 VITALS — BP 128/66 | HR 70 | Ht 74.0 in | Wt 293.0 lb

## 2018-04-24 DIAGNOSIS — G4733 Obstructive sleep apnea (adult) (pediatric): Secondary | ICD-10-CM | POA: Diagnosis not present

## 2018-04-24 DIAGNOSIS — R0781 Pleurodynia: Secondary | ICD-10-CM

## 2018-04-24 DIAGNOSIS — I10 Essential (primary) hypertension: Secondary | ICD-10-CM

## 2018-04-24 DIAGNOSIS — E785 Hyperlipidemia, unspecified: Secondary | ICD-10-CM | POA: Diagnosis not present

## 2018-05-12 DIAGNOSIS — R7989 Other specified abnormal findings of blood chemistry: Secondary | ICD-10-CM | POA: Diagnosis not present

## 2018-06-11 DIAGNOSIS — R7989 Other specified abnormal findings of blood chemistry: Secondary | ICD-10-CM | POA: Diagnosis not present

## 2018-06-11 DIAGNOSIS — E291 Testicular hypofunction: Secondary | ICD-10-CM | POA: Diagnosis not present

## 2018-07-11 DIAGNOSIS — R7989 Other specified abnormal findings of blood chemistry: Secondary | ICD-10-CM | POA: Diagnosis not present

## 2018-07-25 DIAGNOSIS — Z6837 Body mass index (BMI) 37.0-37.9, adult: Secondary | ICD-10-CM | POA: Diagnosis not present

## 2018-07-25 DIAGNOSIS — E119 Type 2 diabetes mellitus without complications: Secondary | ICD-10-CM | POA: Diagnosis not present

## 2018-07-25 DIAGNOSIS — Z1389 Encounter for screening for other disorder: Secondary | ICD-10-CM | POA: Diagnosis not present

## 2018-07-25 DIAGNOSIS — E7849 Other hyperlipidemia: Secondary | ICD-10-CM | POA: Diagnosis not present

## 2018-07-25 DIAGNOSIS — I10 Essential (primary) hypertension: Secondary | ICD-10-CM | POA: Diagnosis not present

## 2018-07-25 DIAGNOSIS — J302 Other seasonal allergic rhinitis: Secondary | ICD-10-CM | POA: Diagnosis not present

## 2018-07-28 DIAGNOSIS — Z6837 Body mass index (BMI) 37.0-37.9, adult: Secondary | ICD-10-CM | POA: Diagnosis not present

## 2018-07-28 DIAGNOSIS — Z1389 Encounter for screening for other disorder: Secondary | ICD-10-CM | POA: Diagnosis not present

## 2018-08-08 DIAGNOSIS — R7989 Other specified abnormal findings of blood chemistry: Secondary | ICD-10-CM | POA: Diagnosis not present

## 2018-10-09 DIAGNOSIS — E291 Testicular hypofunction: Secondary | ICD-10-CM | POA: Diagnosis not present

## 2018-10-09 DIAGNOSIS — R7989 Other specified abnormal findings of blood chemistry: Secondary | ICD-10-CM | POA: Diagnosis not present

## 2018-10-09 DIAGNOSIS — G4733 Obstructive sleep apnea (adult) (pediatric): Secondary | ICD-10-CM | POA: Diagnosis not present

## 2018-11-11 DIAGNOSIS — R7989 Other specified abnormal findings of blood chemistry: Secondary | ICD-10-CM | POA: Diagnosis not present

## 2018-12-02 DIAGNOSIS — J209 Acute bronchitis, unspecified: Secondary | ICD-10-CM | POA: Diagnosis not present

## 2018-12-02 DIAGNOSIS — Z6839 Body mass index (BMI) 39.0-39.9, adult: Secondary | ICD-10-CM | POA: Diagnosis not present

## 2018-12-10 DIAGNOSIS — Z23 Encounter for immunization: Secondary | ICD-10-CM | POA: Diagnosis not present

## 2018-12-10 DIAGNOSIS — R7989 Other specified abnormal findings of blood chemistry: Secondary | ICD-10-CM | POA: Diagnosis not present

## 2018-12-18 DIAGNOSIS — G4733 Obstructive sleep apnea (adult) (pediatric): Secondary | ICD-10-CM | POA: Diagnosis not present

## 2018-12-24 DIAGNOSIS — E119 Type 2 diabetes mellitus without complications: Secondary | ICD-10-CM | POA: Diagnosis not present

## 2018-12-24 DIAGNOSIS — Z6838 Body mass index (BMI) 38.0-38.9, adult: Secondary | ICD-10-CM | POA: Diagnosis not present

## 2018-12-24 DIAGNOSIS — F341 Dysthymic disorder: Secondary | ICD-10-CM | POA: Diagnosis not present

## 2019-01-05 DIAGNOSIS — E119 Type 2 diabetes mellitus without complications: Secondary | ICD-10-CM | POA: Diagnosis not present

## 2019-01-05 DIAGNOSIS — J302 Other seasonal allergic rhinitis: Secondary | ICD-10-CM | POA: Diagnosis not present

## 2019-01-05 DIAGNOSIS — R001 Bradycardia, unspecified: Secondary | ICD-10-CM | POA: Diagnosis not present

## 2019-01-05 DIAGNOSIS — F341 Dysthymic disorder: Secondary | ICD-10-CM | POA: Diagnosis not present

## 2019-01-05 DIAGNOSIS — Z6839 Body mass index (BMI) 39.0-39.9, adult: Secondary | ICD-10-CM | POA: Diagnosis not present

## 2019-01-05 DIAGNOSIS — E7849 Other hyperlipidemia: Secondary | ICD-10-CM | POA: Diagnosis not present

## 2019-01-05 DIAGNOSIS — Z0001 Encounter for general adult medical examination with abnormal findings: Secondary | ICD-10-CM | POA: Diagnosis not present

## 2019-01-05 DIAGNOSIS — I10 Essential (primary) hypertension: Secondary | ICD-10-CM | POA: Diagnosis not present

## 2019-01-09 DIAGNOSIS — R7989 Other specified abnormal findings of blood chemistry: Secondary | ICD-10-CM | POA: Diagnosis not present

## 2019-02-10 DIAGNOSIS — R7989 Other specified abnormal findings of blood chemistry: Secondary | ICD-10-CM | POA: Diagnosis not present

## 2019-03-11 DIAGNOSIS — R7989 Other specified abnormal findings of blood chemistry: Secondary | ICD-10-CM | POA: Diagnosis not present

## 2019-04-02 DIAGNOSIS — G4733 Obstructive sleep apnea (adult) (pediatric): Secondary | ICD-10-CM | POA: Diagnosis not present

## 2019-04-08 DIAGNOSIS — R7989 Other specified abnormal findings of blood chemistry: Secondary | ICD-10-CM | POA: Diagnosis not present

## 2019-07-10 DIAGNOSIS — E785 Hyperlipidemia, unspecified: Secondary | ICD-10-CM

## 2019-07-10 DIAGNOSIS — R079 Chest pain, unspecified: Secondary | ICD-10-CM

## 2019-07-10 DIAGNOSIS — I10 Essential (primary) hypertension: Secondary | ICD-10-CM

## 2019-07-10 DIAGNOSIS — Z87891 Personal history of nicotine dependence: Secondary | ICD-10-CM

## 2019-07-10 DIAGNOSIS — R0602 Shortness of breath: Secondary | ICD-10-CM

## 2019-07-10 DIAGNOSIS — E782 Mixed hyperlipidemia: Secondary | ICD-10-CM

## 2019-07-10 DIAGNOSIS — R0789 Other chest pain: Secondary | ICD-10-CM

## 2019-07-27 DIAGNOSIS — G4733 Obstructive sleep apnea (adult) (pediatric): Secondary | ICD-10-CM | POA: Diagnosis not present

## 2019-07-29 DIAGNOSIS — Z6839 Body mass index (BMI) 39.0-39.9, adult: Secondary | ICD-10-CM | POA: Diagnosis not present

## 2019-07-29 DIAGNOSIS — Z1389 Encounter for screening for other disorder: Secondary | ICD-10-CM | POA: Diagnosis not present

## 2019-07-29 DIAGNOSIS — J069 Acute upper respiratory infection, unspecified: Secondary | ICD-10-CM | POA: Diagnosis not present

## 2019-07-29 DIAGNOSIS — R109 Unspecified abdominal pain: Secondary | ICD-10-CM | POA: Diagnosis not present

## 2019-08-06 DIAGNOSIS — I10 Essential (primary) hypertension: Secondary | ICD-10-CM | POA: Diagnosis not present

## 2019-08-06 DIAGNOSIS — E1165 Type 2 diabetes mellitus with hyperglycemia: Secondary | ICD-10-CM | POA: Diagnosis not present

## 2019-08-06 DIAGNOSIS — Z6839 Body mass index (BMI) 39.0-39.9, adult: Secondary | ICD-10-CM | POA: Diagnosis not present

## 2019-08-06 DIAGNOSIS — E7849 Other hyperlipidemia: Secondary | ICD-10-CM | POA: Diagnosis not present

## 2019-12-25 DIAGNOSIS — Z23 Encounter for immunization: Secondary | ICD-10-CM | POA: Diagnosis not present

## 2019-12-31 DIAGNOSIS — E1165 Type 2 diabetes mellitus with hyperglycemia: Secondary | ICD-10-CM | POA: Diagnosis not present

## 2019-12-31 DIAGNOSIS — Z6841 Body Mass Index (BMI) 40.0 and over, adult: Secondary | ICD-10-CM | POA: Diagnosis not present

## 2019-12-31 DIAGNOSIS — J449 Chronic obstructive pulmonary disease, unspecified: Secondary | ICD-10-CM | POA: Diagnosis not present

## 2019-12-31 DIAGNOSIS — I10 Essential (primary) hypertension: Secondary | ICD-10-CM | POA: Diagnosis not present

## 2019-12-31 DIAGNOSIS — N400 Enlarged prostate without lower urinary tract symptoms: Secondary | ICD-10-CM | POA: Diagnosis not present

## 2019-12-31 DIAGNOSIS — E7849 Other hyperlipidemia: Secondary | ICD-10-CM | POA: Diagnosis not present

## 2020-01-12 DIAGNOSIS — E1165 Type 2 diabetes mellitus with hyperglycemia: Secondary | ICD-10-CM | POA: Diagnosis not present

## 2020-01-12 DIAGNOSIS — Z6841 Body Mass Index (BMI) 40.0 and over, adult: Secondary | ICD-10-CM | POA: Diagnosis not present

## 2020-01-12 DIAGNOSIS — I10 Essential (primary) hypertension: Secondary | ICD-10-CM | POA: Diagnosis not present

## 2022-03-20 DIAGNOSIS — I10 Essential (primary) hypertension: Secondary | ICD-10-CM | POA: Insufficient documentation

## 2022-03-20 DIAGNOSIS — E119 Type 2 diabetes mellitus without complications: Secondary | ICD-10-CM | POA: Insufficient documentation

## 2022-03-20 DIAGNOSIS — M545 Low back pain, unspecified: Secondary | ICD-10-CM

## 2022-03-20 DIAGNOSIS — J449 Chronic obstructive pulmonary disease, unspecified: Secondary | ICD-10-CM | POA: Insufficient documentation

## 2022-03-20 DIAGNOSIS — Z79899 Other long term (current) drug therapy: Secondary | ICD-10-CM | POA: Insufficient documentation

## 2022-03-20 DIAGNOSIS — Z7982 Long term (current) use of aspirin: Secondary | ICD-10-CM | POA: Insufficient documentation

## 2022-03-20 MED ADMIN — Cyclobenzaprine HCl Tab 10 MG: 10 mg | ORAL | NDC 60687055811

## 2022-03-20 MED ADMIN — Lidocaine Patch 5%: 1 | TRANSDERMAL | NDC 00603188010

## 2022-03-20 MED ADMIN — Ketorolac Tromethamine Inj 30 MG/ML: 15 mg | INTRAMUSCULAR | NDC 72266011801

## 2022-08-11 DIAGNOSIS — Z419 Encounter for procedure for purposes other than remedying health state, unspecified: Secondary | ICD-10-CM | POA: Diagnosis not present

## 2022-08-27 DIAGNOSIS — Z0001 Encounter for general adult medical examination with abnormal findings: Secondary | ICD-10-CM | POA: Diagnosis not present

## 2022-08-27 DIAGNOSIS — E1165 Type 2 diabetes mellitus with hyperglycemia: Secondary | ICD-10-CM | POA: Diagnosis not present

## 2022-08-31 DIAGNOSIS — E6609 Other obesity due to excess calories: Secondary | ICD-10-CM | POA: Diagnosis not present

## 2022-08-31 DIAGNOSIS — Z6838 Body mass index (BMI) 38.0-38.9, adult: Secondary | ICD-10-CM | POA: Diagnosis not present

## 2022-08-31 DIAGNOSIS — Z0001 Encounter for general adult medical examination with abnormal findings: Secondary | ICD-10-CM | POA: Diagnosis not present

## 2022-08-31 DIAGNOSIS — J449 Chronic obstructive pulmonary disease, unspecified: Secondary | ICD-10-CM | POA: Diagnosis not present

## 2022-08-31 DIAGNOSIS — E782 Mixed hyperlipidemia: Secondary | ICD-10-CM | POA: Diagnosis not present

## 2022-08-31 DIAGNOSIS — Z1331 Encounter for screening for depression: Secondary | ICD-10-CM | POA: Diagnosis not present

## 2022-08-31 DIAGNOSIS — E1165 Type 2 diabetes mellitus with hyperglycemia: Secondary | ICD-10-CM | POA: Diagnosis not present

## 2022-09-10 DIAGNOSIS — Z419 Encounter for procedure for purposes other than remedying health state, unspecified: Secondary | ICD-10-CM | POA: Diagnosis not present

## 2022-10-04 DIAGNOSIS — Z6838 Body mass index (BMI) 38.0-38.9, adult: Secondary | ICD-10-CM | POA: Diagnosis not present

## 2022-10-04 DIAGNOSIS — J3089 Other allergic rhinitis: Secondary | ICD-10-CM | POA: Diagnosis not present

## 2022-10-04 DIAGNOSIS — E6609 Other obesity due to excess calories: Secondary | ICD-10-CM | POA: Diagnosis not present

## 2022-10-04 DIAGNOSIS — E1165 Type 2 diabetes mellitus with hyperglycemia: Secondary | ICD-10-CM | POA: Diagnosis not present

## 2022-10-04 DIAGNOSIS — J449 Chronic obstructive pulmonary disease, unspecified: Secondary | ICD-10-CM | POA: Diagnosis not present

## 2022-10-08 DIAGNOSIS — J449 Chronic obstructive pulmonary disease, unspecified: Secondary | ICD-10-CM

## 2022-10-08 DIAGNOSIS — Z0001 Encounter for general adult medical examination with abnormal findings: Secondary | ICD-10-CM

## 2022-10-11 DIAGNOSIS — Z419 Encounter for procedure for purposes other than remedying health state, unspecified: Secondary | ICD-10-CM | POA: Diagnosis not present

## 2022-10-11 DIAGNOSIS — G4733 Obstructive sleep apnea (adult) (pediatric): Secondary | ICD-10-CM | POA: Diagnosis not present

## 2022-11-11 DIAGNOSIS — Z419 Encounter for procedure for purposes other than remedying health state, unspecified: Secondary | ICD-10-CM | POA: Diagnosis not present

## 2022-11-15 DIAGNOSIS — K2270 Barrett's esophagus without dysplasia: Secondary | ICD-10-CM | POA: Diagnosis not present

## 2022-11-15 DIAGNOSIS — Z800 Family history of malignant neoplasm of digestive organs: Secondary | ICD-10-CM

## 2022-11-15 DIAGNOSIS — Z1211 Encounter for screening for malignant neoplasm of colon: Secondary | ICD-10-CM | POA: Diagnosis not present

## 2022-11-15 DIAGNOSIS — K219 Gastro-esophageal reflux disease without esophagitis: Secondary | ICD-10-CM | POA: Diagnosis not present

## 2022-11-26 DIAGNOSIS — G4733 Obstructive sleep apnea (adult) (pediatric): Secondary | ICD-10-CM | POA: Diagnosis not present

## 2022-12-11 DIAGNOSIS — Z419 Encounter for procedure for purposes other than remedying health state, unspecified: Secondary | ICD-10-CM | POA: Diagnosis not present

## 2022-12-18 DIAGNOSIS — E1159 Type 2 diabetes mellitus with other circulatory complications: Secondary | ICD-10-CM | POA: Diagnosis not present

## 2022-12-18 DIAGNOSIS — J3089 Other allergic rhinitis: Secondary | ICD-10-CM | POA: Diagnosis not present

## 2022-12-18 DIAGNOSIS — E782 Mixed hyperlipidemia: Secondary | ICD-10-CM | POA: Diagnosis not present

## 2022-12-18 DIAGNOSIS — Z6838 Body mass index (BMI) 38.0-38.9, adult: Secondary | ICD-10-CM | POA: Diagnosis not present

## 2022-12-18 DIAGNOSIS — F17201 Nicotine dependence, unspecified, in remission: Secondary | ICD-10-CM | POA: Diagnosis not present

## 2022-12-18 DIAGNOSIS — G4733 Obstructive sleep apnea (adult) (pediatric): Secondary | ICD-10-CM | POA: Diagnosis not present

## 2022-12-18 DIAGNOSIS — J449 Chronic obstructive pulmonary disease, unspecified: Secondary | ICD-10-CM | POA: Diagnosis not present

## 2022-12-18 DIAGNOSIS — E6609 Other obesity due to excess calories: Secondary | ICD-10-CM | POA: Diagnosis not present

## 2022-12-26 DIAGNOSIS — G4733 Obstructive sleep apnea (adult) (pediatric): Secondary | ICD-10-CM | POA: Diagnosis not present

## 2022-12-27 DIAGNOSIS — E119 Type 2 diabetes mellitus without complications: Secondary | ICD-10-CM | POA: Diagnosis not present

## 2022-12-27 DIAGNOSIS — I10 Essential (primary) hypertension: Secondary | ICD-10-CM | POA: Diagnosis not present

## 2022-12-27 DIAGNOSIS — Z01818 Encounter for other preprocedural examination: Secondary | ICD-10-CM | POA: Insufficient documentation

## 2022-12-27 DIAGNOSIS — Z01812 Encounter for preprocedural laboratory examination: Secondary | ICD-10-CM | POA: Diagnosis present

## 2022-12-31 DIAGNOSIS — K648 Other hemorrhoids: Secondary | ICD-10-CM | POA: Diagnosis not present

## 2022-12-31 DIAGNOSIS — Z6836 Body mass index (BMI) 36.0-36.9, adult: Secondary | ICD-10-CM | POA: Insufficient documentation

## 2022-12-31 DIAGNOSIS — G4733 Obstructive sleep apnea (adult) (pediatric): Secondary | ICD-10-CM | POA: Insufficient documentation

## 2022-12-31 DIAGNOSIS — I10 Essential (primary) hypertension: Secondary | ICD-10-CM | POA: Insufficient documentation

## 2022-12-31 DIAGNOSIS — Z87891 Personal history of nicotine dependence: Secondary | ICD-10-CM | POA: Insufficient documentation

## 2022-12-31 DIAGNOSIS — Z7984 Long term (current) use of oral hypoglycemic drugs: Secondary | ICD-10-CM | POA: Insufficient documentation

## 2022-12-31 DIAGNOSIS — J449 Chronic obstructive pulmonary disease, unspecified: Secondary | ICD-10-CM | POA: Insufficient documentation

## 2022-12-31 DIAGNOSIS — Z1211 Encounter for screening for malignant neoplasm of colon: Secondary | ICD-10-CM | POA: Diagnosis not present

## 2022-12-31 DIAGNOSIS — D123 Benign neoplasm of transverse colon: Secondary | ICD-10-CM | POA: Diagnosis not present

## 2022-12-31 DIAGNOSIS — D122 Benign neoplasm of ascending colon: Secondary | ICD-10-CM | POA: Insufficient documentation

## 2022-12-31 DIAGNOSIS — Z800 Family history of malignant neoplasm of digestive organs: Secondary | ICD-10-CM | POA: Diagnosis not present

## 2022-12-31 DIAGNOSIS — E119 Type 2 diabetes mellitus without complications: Secondary | ICD-10-CM | POA: Insufficient documentation

## 2022-12-31 DIAGNOSIS — D126 Benign neoplasm of colon, unspecified: Secondary | ICD-10-CM | POA: Diagnosis not present

## 2022-12-31 DIAGNOSIS — K2270 Barrett's esophagus without dysplasia: Secondary | ICD-10-CM | POA: Diagnosis not present

## 2022-12-31 DIAGNOSIS — K449 Diaphragmatic hernia without obstruction or gangrene: Secondary | ICD-10-CM | POA: Diagnosis not present

## 2022-12-31 HISTORY — PX: BIOPSY: SHX5522

## 2022-12-31 HISTORY — PX: ESOPHAGOGASTRODUODENOSCOPY (EGD) WITH PROPOFOL: SHX5813

## 2022-12-31 HISTORY — PX: COLONOSCOPY WITH PROPOFOL: SHX5780

## 2022-12-31 HISTORY — PX: POLYPECTOMY: SHX5525

## 2022-12-31 MED ADMIN — SIMETHICONE IN STERILE WATER 1000ML IRRIG. (ENDO): 100 mL | NDC 99999020140

## 2022-12-31 MED ADMIN — Lidocaine HCl Local Inj 1%: 50 mg | INTRADERMAL | NDC 63323020110

## 2022-12-31 MED ADMIN — Propofol IV Emul 500 MG/50ML (10 MG/ML): 150 ug/kg/min | INTRAVENOUS | NDC 00069023420

## 2022-12-31 MED ADMIN — PROPOFOL 200 MG/20ML IV EMUL: 50 mg | INTRAVENOUS | NDC 00069020910

## 2022-12-31 MED ADMIN — PROPOFOL 200 MG/20ML IV EMUL: 10 mg | INTRAVENOUS | NDC 00069020910

## 2023-01-11 DIAGNOSIS — Z419 Encounter for procedure for purposes other than remedying health state, unspecified: Secondary | ICD-10-CM | POA: Diagnosis not present

## 2023-01-23 DIAGNOSIS — F17201 Nicotine dependence, unspecified, in remission: Secondary | ICD-10-CM | POA: Diagnosis not present

## 2023-01-23 DIAGNOSIS — N390 Urinary tract infection, site not specified: Secondary | ICD-10-CM | POA: Diagnosis not present

## 2023-01-23 DIAGNOSIS — I700 Atherosclerosis of aorta: Secondary | ICD-10-CM | POA: Diagnosis not present

## 2023-01-23 DIAGNOSIS — J069 Acute upper respiratory infection, unspecified: Secondary | ICD-10-CM | POA: Diagnosis not present

## 2023-01-23 DIAGNOSIS — R0602 Shortness of breath: Secondary | ICD-10-CM | POA: Diagnosis not present

## 2023-01-23 DIAGNOSIS — Z20828 Contact with and (suspected) exposure to other viral communicable diseases: Secondary | ICD-10-CM | POA: Diagnosis not present

## 2023-01-23 DIAGNOSIS — Z6838 Body mass index (BMI) 38.0-38.9, adult: Secondary | ICD-10-CM | POA: Diagnosis not present

## 2023-01-23 DIAGNOSIS — E6609 Other obesity due to excess calories: Secondary | ICD-10-CM | POA: Diagnosis not present

## 2023-01-23 DIAGNOSIS — R918 Other nonspecific abnormal finding of lung field: Secondary | ICD-10-CM | POA: Diagnosis not present

## 2023-01-26 DIAGNOSIS — G4733 Obstructive sleep apnea (adult) (pediatric): Secondary | ICD-10-CM | POA: Diagnosis not present

## 2023-02-10 DIAGNOSIS — Z419 Encounter for procedure for purposes other than remedying health state, unspecified: Secondary | ICD-10-CM | POA: Diagnosis not present

## 2023-02-25 DIAGNOSIS — G4733 Obstructive sleep apnea (adult) (pediatric): Secondary | ICD-10-CM | POA: Diagnosis not present

## 2023-03-13 DIAGNOSIS — Z419 Encounter for procedure for purposes other than remedying health state, unspecified: Secondary | ICD-10-CM | POA: Diagnosis not present

## 2023-03-21 DIAGNOSIS — E119 Type 2 diabetes mellitus without complications: Secondary | ICD-10-CM | POA: Diagnosis not present

## 2023-03-28 DIAGNOSIS — G4733 Obstructive sleep apnea (adult) (pediatric): Secondary | ICD-10-CM | POA: Diagnosis not present

## 2023-04-04 DIAGNOSIS — Z6838 Body mass index (BMI) 38.0-38.9, adult: Secondary | ICD-10-CM | POA: Diagnosis not present

## 2023-04-04 DIAGNOSIS — E1159 Type 2 diabetes mellitus with other circulatory complications: Secondary | ICD-10-CM | POA: Diagnosis not present

## 2023-04-04 DIAGNOSIS — G4733 Obstructive sleep apnea (adult) (pediatric): Secondary | ICD-10-CM | POA: Diagnosis not present

## 2023-04-04 DIAGNOSIS — J449 Chronic obstructive pulmonary disease, unspecified: Secondary | ICD-10-CM | POA: Diagnosis not present

## 2023-04-04 DIAGNOSIS — J3089 Other allergic rhinitis: Secondary | ICD-10-CM | POA: Diagnosis not present

## 2023-04-04 DIAGNOSIS — E782 Mixed hyperlipidemia: Secondary | ICD-10-CM | POA: Diagnosis not present

## 2023-04-11 DIAGNOSIS — Z0001 Encounter for general adult medical examination with abnormal findings: Secondary | ICD-10-CM

## 2023-04-11 DIAGNOSIS — J449 Chronic obstructive pulmonary disease, unspecified: Secondary | ICD-10-CM

## 2023-04-11 DIAGNOSIS — E7849 Other hyperlipidemia: Secondary | ICD-10-CM | POA: Diagnosis not present

## 2023-04-11 DIAGNOSIS — E1159 Type 2 diabetes mellitus with other circulatory complications: Secondary | ICD-10-CM | POA: Diagnosis not present

## 2023-04-11 DIAGNOSIS — F17201 Nicotine dependence, unspecified, in remission: Secondary | ICD-10-CM

## 2023-04-13 DIAGNOSIS — Z419 Encounter for procedure for purposes other than remedying health state, unspecified: Secondary | ICD-10-CM | POA: Diagnosis not present

## 2023-04-17 DIAGNOSIS — G4733 Obstructive sleep apnea (adult) (pediatric): Secondary | ICD-10-CM | POA: Diagnosis not present

## 2023-04-26 DIAGNOSIS — Z0001 Encounter for general adult medical examination with abnormal findings: Secondary | ICD-10-CM | POA: Diagnosis not present

## 2023-04-26 DIAGNOSIS — F17201 Nicotine dependence, unspecified, in remission: Secondary | ICD-10-CM | POA: Diagnosis not present

## 2023-04-26 DIAGNOSIS — J449 Chronic obstructive pulmonary disease, unspecified: Secondary | ICD-10-CM | POA: Diagnosis not present

## 2023-04-26 DIAGNOSIS — Z87891 Personal history of nicotine dependence: Secondary | ICD-10-CM | POA: Diagnosis not present

## 2023-04-28 DIAGNOSIS — G4733 Obstructive sleep apnea (adult) (pediatric): Secondary | ICD-10-CM | POA: Diagnosis not present

## 2023-05-03 DIAGNOSIS — M545 Low back pain, unspecified: Secondary | ICD-10-CM | POA: Diagnosis not present

## 2023-05-03 DIAGNOSIS — E6609 Other obesity due to excess calories: Secondary | ICD-10-CM | POA: Diagnosis not present

## 2023-05-03 DIAGNOSIS — Z6839 Body mass index (BMI) 39.0-39.9, adult: Secondary | ICD-10-CM | POA: Diagnosis not present

## 2023-05-03 DIAGNOSIS — R3911 Hesitancy of micturition: Secondary | ICD-10-CM | POA: Diagnosis not present

## 2023-05-11 DIAGNOSIS — Z419 Encounter for procedure for purposes other than remedying health state, unspecified: Secondary | ICD-10-CM | POA: Diagnosis not present

## 2023-05-15 DIAGNOSIS — M545 Low back pain, unspecified: Secondary | ICD-10-CM

## 2023-05-15 DIAGNOSIS — M25551 Pain in right hip: Secondary | ICD-10-CM

## 2023-05-15 DIAGNOSIS — M25552 Pain in left hip: Secondary | ICD-10-CM

## 2023-05-26 DIAGNOSIS — G4733 Obstructive sleep apnea (adult) (pediatric): Secondary | ICD-10-CM | POA: Diagnosis not present

## 2023-06-12 DIAGNOSIS — R29898 Other symptoms and signs involving the musculoskeletal system: Secondary | ICD-10-CM | POA: Diagnosis not present

## 2023-06-12 DIAGNOSIS — M5459 Other low back pain: Secondary | ICD-10-CM | POA: Insufficient documentation

## 2023-06-12 DIAGNOSIS — Z7409 Other reduced mobility: Secondary | ICD-10-CM | POA: Insufficient documentation

## 2023-06-12 DIAGNOSIS — M25551 Pain in right hip: Secondary | ICD-10-CM | POA: Insufficient documentation

## 2023-06-12 DIAGNOSIS — M25552 Pain in left hip: Secondary | ICD-10-CM | POA: Insufficient documentation

## 2023-06-12 DIAGNOSIS — M545 Low back pain, unspecified: Secondary | ICD-10-CM | POA: Diagnosis not present

## 2023-06-18 DIAGNOSIS — M5459 Other low back pain: Secondary | ICD-10-CM

## 2023-06-18 DIAGNOSIS — Z7409 Other reduced mobility: Secondary | ICD-10-CM | POA: Diagnosis not present

## 2023-06-18 DIAGNOSIS — R29898 Other symptoms and signs involving the musculoskeletal system: Secondary | ICD-10-CM | POA: Diagnosis not present

## 2023-06-18 DIAGNOSIS — M25551 Pain in right hip: Secondary | ICD-10-CM | POA: Diagnosis not present

## 2023-06-18 DIAGNOSIS — M25552 Pain in left hip: Secondary | ICD-10-CM | POA: Diagnosis not present

## 2023-06-18 DIAGNOSIS — M545 Low back pain, unspecified: Secondary | ICD-10-CM | POA: Diagnosis not present

## 2023-06-19 DIAGNOSIS — M25551 Pain in right hip: Secondary | ICD-10-CM | POA: Diagnosis not present

## 2023-06-19 DIAGNOSIS — Z7409 Other reduced mobility: Secondary | ICD-10-CM | POA: Diagnosis not present

## 2023-06-19 DIAGNOSIS — R29898 Other symptoms and signs involving the musculoskeletal system: Secondary | ICD-10-CM

## 2023-06-19 DIAGNOSIS — M25552 Pain in left hip: Secondary | ICD-10-CM | POA: Diagnosis not present

## 2023-06-19 DIAGNOSIS — M5459 Other low back pain: Secondary | ICD-10-CM | POA: Diagnosis not present

## 2023-06-19 DIAGNOSIS — M545 Low back pain, unspecified: Secondary | ICD-10-CM | POA: Diagnosis not present

## 2023-06-22 DIAGNOSIS — Z419 Encounter for procedure for purposes other than remedying health state, unspecified: Secondary | ICD-10-CM | POA: Diagnosis not present

## 2023-06-25 DIAGNOSIS — M5459 Other low back pain: Secondary | ICD-10-CM

## 2023-06-25 DIAGNOSIS — R29898 Other symptoms and signs involving the musculoskeletal system: Secondary | ICD-10-CM | POA: Diagnosis not present

## 2023-06-25 DIAGNOSIS — M25551 Pain in right hip: Secondary | ICD-10-CM | POA: Diagnosis not present

## 2023-06-25 DIAGNOSIS — M545 Low back pain, unspecified: Secondary | ICD-10-CM | POA: Diagnosis not present

## 2023-06-25 DIAGNOSIS — Z7409 Other reduced mobility: Secondary | ICD-10-CM | POA: Diagnosis not present

## 2023-06-25 DIAGNOSIS — M25552 Pain in left hip: Secondary | ICD-10-CM | POA: Diagnosis not present

## 2023-06-26 DIAGNOSIS — M5459 Other low back pain: Secondary | ICD-10-CM

## 2023-06-26 DIAGNOSIS — M545 Low back pain, unspecified: Secondary | ICD-10-CM | POA: Diagnosis not present

## 2023-06-26 DIAGNOSIS — Z7409 Other reduced mobility: Secondary | ICD-10-CM | POA: Diagnosis not present

## 2023-06-26 DIAGNOSIS — R29898 Other symptoms and signs involving the musculoskeletal system: Secondary | ICD-10-CM

## 2023-06-26 DIAGNOSIS — M25551 Pain in right hip: Secondary | ICD-10-CM | POA: Diagnosis not present

## 2023-06-26 DIAGNOSIS — M25552 Pain in left hip: Secondary | ICD-10-CM | POA: Diagnosis not present

## 2023-07-02 DIAGNOSIS — Z8042 Family history of malignant neoplasm of prostate: Secondary | ICD-10-CM | POA: Insufficient documentation

## 2023-07-02 DIAGNOSIS — N401 Enlarged prostate with lower urinary tract symptoms: Secondary | ICD-10-CM | POA: Insufficient documentation

## 2023-07-03 DIAGNOSIS — R29898 Other symptoms and signs involving the musculoskeletal system: Secondary | ICD-10-CM | POA: Diagnosis not present

## 2023-07-03 DIAGNOSIS — M5459 Other low back pain: Secondary | ICD-10-CM

## 2023-07-03 DIAGNOSIS — Z7409 Other reduced mobility: Secondary | ICD-10-CM

## 2023-07-03 DIAGNOSIS — M25551 Pain in right hip: Secondary | ICD-10-CM | POA: Diagnosis not present

## 2023-07-03 DIAGNOSIS — Z8042 Family history of malignant neoplasm of prostate: Secondary | ICD-10-CM | POA: Diagnosis not present

## 2023-07-03 DIAGNOSIS — M545 Low back pain, unspecified: Secondary | ICD-10-CM | POA: Diagnosis not present

## 2023-07-03 DIAGNOSIS — N401 Enlarged prostate with lower urinary tract symptoms: Secondary | ICD-10-CM

## 2023-07-03 DIAGNOSIS — M25552 Pain in left hip: Secondary | ICD-10-CM | POA: Diagnosis not present

## 2023-07-03 DIAGNOSIS — Z789 Other specified health status: Secondary | ICD-10-CM | POA: Diagnosis not present

## 2023-07-03 DIAGNOSIS — R3911 Hesitancy of micturition: Secondary | ICD-10-CM | POA: Diagnosis not present

## 2023-07-05 DIAGNOSIS — Z7409 Other reduced mobility: Secondary | ICD-10-CM

## 2023-07-05 DIAGNOSIS — M25551 Pain in right hip: Secondary | ICD-10-CM | POA: Diagnosis not present

## 2023-07-05 DIAGNOSIS — R29898 Other symptoms and signs involving the musculoskeletal system: Secondary | ICD-10-CM

## 2023-07-05 DIAGNOSIS — M5459 Other low back pain: Secondary | ICD-10-CM

## 2023-07-05 DIAGNOSIS — M25552 Pain in left hip: Secondary | ICD-10-CM | POA: Diagnosis not present

## 2023-07-05 DIAGNOSIS — M545 Low back pain, unspecified: Secondary | ICD-10-CM | POA: Diagnosis not present

## 2023-07-09 DIAGNOSIS — M25551 Pain in right hip: Secondary | ICD-10-CM | POA: Diagnosis not present

## 2023-07-09 DIAGNOSIS — R29898 Other symptoms and signs involving the musculoskeletal system: Secondary | ICD-10-CM

## 2023-07-09 DIAGNOSIS — Z7409 Other reduced mobility: Secondary | ICD-10-CM | POA: Diagnosis not present

## 2023-07-09 DIAGNOSIS — M5459 Other low back pain: Secondary | ICD-10-CM | POA: Diagnosis not present

## 2023-07-09 DIAGNOSIS — M545 Low back pain, unspecified: Secondary | ICD-10-CM | POA: Diagnosis not present

## 2023-07-09 DIAGNOSIS — M25552 Pain in left hip: Secondary | ICD-10-CM | POA: Diagnosis not present

## 2023-07-18 DIAGNOSIS — M5459 Other low back pain: Secondary | ICD-10-CM

## 2023-07-18 DIAGNOSIS — Z7409 Other reduced mobility: Secondary | ICD-10-CM | POA: Diagnosis not present

## 2023-07-18 DIAGNOSIS — R29898 Other symptoms and signs involving the musculoskeletal system: Secondary | ICD-10-CM | POA: Diagnosis not present

## 2023-07-22 DIAGNOSIS — Z419 Encounter for procedure for purposes other than remedying health state, unspecified: Secondary | ICD-10-CM | POA: Diagnosis not present

## 2023-07-25 DIAGNOSIS — Z7409 Other reduced mobility: Secondary | ICD-10-CM

## 2023-07-25 DIAGNOSIS — M5459 Other low back pain: Secondary | ICD-10-CM | POA: Diagnosis not present

## 2023-07-25 DIAGNOSIS — R29898 Other symptoms and signs involving the musculoskeletal system: Secondary | ICD-10-CM

## 2023-07-26 DIAGNOSIS — D485 Neoplasm of uncertain behavior of skin: Secondary | ICD-10-CM | POA: Diagnosis not present

## 2023-07-26 DIAGNOSIS — L859 Epidermal thickening, unspecified: Secondary | ICD-10-CM | POA: Diagnosis not present

## 2023-07-26 DIAGNOSIS — Z6838 Body mass index (BMI) 38.0-38.9, adult: Secondary | ICD-10-CM | POA: Diagnosis not present

## 2023-07-26 DIAGNOSIS — E6609 Other obesity due to excess calories: Secondary | ICD-10-CM | POA: Diagnosis not present

## 2023-08-07 DIAGNOSIS — M545 Low back pain, unspecified: Secondary | ICD-10-CM | POA: Diagnosis not present

## 2023-08-14 DIAGNOSIS — G4733 Obstructive sleep apnea (adult) (pediatric): Secondary | ICD-10-CM | POA: Diagnosis not present

## 2023-08-22 DIAGNOSIS — Z419 Encounter for procedure for purposes other than remedying health state, unspecified: Secondary | ICD-10-CM | POA: Diagnosis not present

## 2023-08-26 DIAGNOSIS — G4733 Obstructive sleep apnea (adult) (pediatric): Secondary | ICD-10-CM | POA: Diagnosis not present

## 2023-09-02 DIAGNOSIS — J449 Chronic obstructive pulmonary disease, unspecified: Secondary | ICD-10-CM | POA: Diagnosis not present

## 2023-09-02 DIAGNOSIS — Z0001 Encounter for general adult medical examination with abnormal findings: Secondary | ICD-10-CM | POA: Diagnosis not present

## 2023-09-02 DIAGNOSIS — G4733 Obstructive sleep apnea (adult) (pediatric): Secondary | ICD-10-CM | POA: Diagnosis not present

## 2023-09-02 DIAGNOSIS — K635 Polyp of colon: Secondary | ICD-10-CM | POA: Diagnosis not present

## 2023-09-02 DIAGNOSIS — E782 Mixed hyperlipidemia: Secondary | ICD-10-CM | POA: Diagnosis not present

## 2023-09-02 DIAGNOSIS — Z1331 Encounter for screening for depression: Secondary | ICD-10-CM | POA: Diagnosis not present

## 2023-09-02 DIAGNOSIS — Z6837 Body mass index (BMI) 37.0-37.9, adult: Secondary | ICD-10-CM | POA: Diagnosis not present

## 2023-09-02 DIAGNOSIS — E6609 Other obesity due to excess calories: Secondary | ICD-10-CM | POA: Diagnosis not present

## 2023-09-02 DIAGNOSIS — E1159 Type 2 diabetes mellitus with other circulatory complications: Secondary | ICD-10-CM | POA: Diagnosis not present

## 2023-09-12 DIAGNOSIS — M5030 Other cervical disc degeneration, unspecified cervical region: Secondary | ICD-10-CM | POA: Diagnosis not present

## 2023-09-12 DIAGNOSIS — Z6838 Body mass index (BMI) 38.0-38.9, adult: Secondary | ICD-10-CM | POA: Diagnosis not present

## 2023-09-12 DIAGNOSIS — E6609 Other obesity due to excess calories: Secondary | ICD-10-CM | POA: Diagnosis not present

## 2023-09-21 DIAGNOSIS — Z419 Encounter for procedure for purposes other than remedying health state, unspecified: Secondary | ICD-10-CM | POA: Diagnosis not present

## 2023-10-21 DIAGNOSIS — G4733 Obstructive sleep apnea (adult) (pediatric): Secondary | ICD-10-CM | POA: Diagnosis not present

## 2023-10-22 DIAGNOSIS — Z419 Encounter for procedure for purposes other than remedying health state, unspecified: Secondary | ICD-10-CM | POA: Diagnosis not present

## 2023-10-26 DIAGNOSIS — G4733 Obstructive sleep apnea (adult) (pediatric): Secondary | ICD-10-CM | POA: Diagnosis not present

## 2023-11-21 DIAGNOSIS — G4733 Obstructive sleep apnea (adult) (pediatric): Secondary | ICD-10-CM | POA: Diagnosis not present

## 2023-11-21 DIAGNOSIS — E6609 Other obesity due to excess calories: Secondary | ICD-10-CM | POA: Diagnosis not present

## 2023-11-21 DIAGNOSIS — J449 Chronic obstructive pulmonary disease, unspecified: Secondary | ICD-10-CM | POA: Diagnosis not present

## 2023-11-21 DIAGNOSIS — F17201 Nicotine dependence, unspecified, in remission: Secondary | ICD-10-CM | POA: Diagnosis not present

## 2023-11-21 DIAGNOSIS — E782 Mixed hyperlipidemia: Secondary | ICD-10-CM | POA: Diagnosis not present

## 2023-11-21 DIAGNOSIS — J3089 Other allergic rhinitis: Secondary | ICD-10-CM | POA: Diagnosis not present

## 2023-11-21 DIAGNOSIS — E1159 Type 2 diabetes mellitus with other circulatory complications: Secondary | ICD-10-CM | POA: Diagnosis not present

## 2023-11-21 DIAGNOSIS — Z6838 Body mass index (BMI) 38.0-38.9, adult: Secondary | ICD-10-CM | POA: Diagnosis not present

## 2023-11-22 DIAGNOSIS — Z419 Encounter for procedure for purposes other than remedying health state, unspecified: Secondary | ICD-10-CM | POA: Diagnosis not present

## 2024-01-30 DIAGNOSIS — Z7982 Long term (current) use of aspirin: Secondary | ICD-10-CM | POA: Insufficient documentation

## 2024-01-30 DIAGNOSIS — E119 Type 2 diabetes mellitus without complications: Secondary | ICD-10-CM | POA: Diagnosis not present

## 2024-01-30 DIAGNOSIS — S22089A Unspecified fracture of T11-T12 vertebra, initial encounter for closed fracture: Secondary | ICD-10-CM | POA: Insufficient documentation

## 2024-01-30 DIAGNOSIS — S0990XA Unspecified injury of head, initial encounter: Secondary | ICD-10-CM | POA: Insufficient documentation

## 2024-01-30 DIAGNOSIS — R109 Unspecified abdominal pain: Secondary | ICD-10-CM | POA: Diagnosis not present

## 2024-01-30 DIAGNOSIS — Z79899 Other long term (current) drug therapy: Secondary | ICD-10-CM | POA: Insufficient documentation

## 2024-01-30 DIAGNOSIS — J449 Chronic obstructive pulmonary disease, unspecified: Secondary | ICD-10-CM | POA: Diagnosis not present

## 2024-01-30 DIAGNOSIS — I10 Essential (primary) hypertension: Secondary | ICD-10-CM | POA: Insufficient documentation

## 2024-01-30 DIAGNOSIS — S29002A Unspecified injury of muscle and tendon of back wall of thorax, initial encounter: Secondary | ICD-10-CM | POA: Diagnosis present

## 2024-01-30 DIAGNOSIS — Z7984 Long term (current) use of oral hypoglycemic drugs: Secondary | ICD-10-CM | POA: Insufficient documentation

## 2024-01-30 DIAGNOSIS — S299XXA Unspecified injury of thorax, initial encounter: Secondary | ICD-10-CM | POA: Diagnosis not present

## 2024-01-30 DIAGNOSIS — W11XXXA Fall on and from ladder, initial encounter: Secondary | ICD-10-CM | POA: Diagnosis not present

## 2024-01-30 DIAGNOSIS — S30811A Abrasion of abdominal wall, initial encounter: Secondary | ICD-10-CM | POA: Diagnosis not present

## 2024-01-30 DIAGNOSIS — T07XXXA Unspecified multiple injuries, initial encounter: Secondary | ICD-10-CM

## 2024-01-30 DIAGNOSIS — W19XXXA Unspecified fall, initial encounter: Secondary | ICD-10-CM

## 2024-01-30 MED ADMIN — Oxycodone w/ Acetaminophen Tab 5-325 MG: 1 | ORAL | NDC 00406051223

## 2024-01-30 MED ADMIN — Morphine Sulfate IV Soln PF 4 MG/ML: 4 mg | INTRAVENOUS | NDC 76045000501

## 2024-01-30 MED ADMIN — Fentanyl Citrate PF Soln Prefilled Syringe 50 MCG/ML: 50 ug | INTRAVENOUS | NDC 63323080801

## 2024-01-30 MED ADMIN — Ondansetron HCl Inj 4 MG/2ML (2 MG/ML): 4 mg | INTRAVENOUS | NDC 00409475518

## 2024-01-30 MED ADMIN — Hydromorphone HCl Inj 1 MG/ML: 1 mg | INTRAVENOUS | NDC 76045000901

## 2024-01-30 MED ADMIN — Iohexol IV Soln 350 MG/ML: 75 mL | INTRAVENOUS | NDC 00407141490

## 2024-01-30 MED ADMIN — Ondansetron Orally Disintegrating Tab 4 MG: 4 mg | ORAL | NDC 16714020010

## 2024-02-03 DIAGNOSIS — M545 Low back pain, unspecified: Secondary | ICD-10-CM | POA: Diagnosis present

## 2024-02-03 DIAGNOSIS — G8929 Other chronic pain: Secondary | ICD-10-CM | POA: Insufficient documentation

## 2024-02-04 MED ADMIN — Oxycodone HCl Tab 5 MG: 10 mg | ORAL | NDC 00406055223

## 2024-02-04 MED ADMIN — Morphine Sulfate IV Soln PF 4 MG/ML: 8 mg | INTRAVENOUS | NDC 00641612501

## 2024-02-04 NOTE — ED Provider Notes (Incomplete)
 [place] EMERGENCY DEPARTMENT AT [facility] Provider Note   CSN: [id number] Arrival date & time: 02/03/24  2130     History Chief Complaint  Patient presents with  . Abdominal Pain  . Constipation    HPI [name] is a 63 y.o. male presenting for chief complaint of abdominal pain and constipation. States that he recently fell and broke T11. Got home from that day and he started having worsening back pain.  Worse with motion.  Tonight he states he came in because he was having difficulty ambulating and having a bowel movement.  Using percocet at home with temporary relief.   Patient's recorded medical, surgical, social, medication list and allergies were reviewed in the Snapshot window as part of the initial history.   Review of Systems   Review of Systems  Constitutional:  Negative for chills and fever.  HENT:  Negative for ear pain and sore throat.   Eyes:  Negative for pain and visual disturbance.  Respiratory:  Negative for cough and shortness of breath.   Cardiovascular:  Negative for chest pain and palpitations.  Gastrointestinal:  Positive for constipation. Negative for abdominal pain and vomiting.  Genitourinary:  Negative for dysuria and hematuria.  Musculoskeletal:  Positive for back pain. Negative for arthralgias.  Skin:  Negative for color change and rash.  Neurological:  Negative for seizures and syncope.  All other systems reviewed and are negative.   Physical Exam Updated Vital Signs BP (!) 156/67   Pulse (!) 57   Temp 97.8 F (36.6 C) (Oral)   Resp (!) 22   SpO2 98%  Physical Exam Vitals and nursing note reviewed.  Constitutional:      General: He is not in acute distress.    Appearance: He is well-developed.  HENT:     Head: Normocephalic and atraumatic.  Eyes:     Conjunctiva/sclera: Conjunctivae normal.  Cardiovascular:     Rate and Rhythm: Normal rate and regular rhythm.     Heart sounds: No murmur heard. Pulmonary:      Effort: Pulmonary effort is normal. No respiratory distress.     Breath sounds: Normal breath sounds.  Abdominal:     Palpations: Abdomen is soft.     Tenderness: There is no abdominal tenderness.  Musculoskeletal:        General: No swelling.     Cervical back: Neck supple.  Skin:    General: Skin is warm and dry.     Capillary Refill: Capillary refill takes less than 2 seconds.  Neurological:     Mental Status: He is alert.  Psychiatric:        Mood and Affect: Mood normal.      ED Course/ Medical Decision Making/ A&P    Procedures Procedures   Medications Ordered in ED Medications - No data to display  Medical Decision Making:   [name] is a 63 y.o. male who presented to the ED today with *** detailed above.    {crccomplexity:27900} Complete initial physical exam performed, notably the patient  was ***.    Reviewed and confirmed nursing documentation for past medical history, family history, social history.    Initial Assessment:   With the patient's presentation of ***, most likely diagnosis is ***. Other diagnoses were considered including (but not limited to) ***. These are considered less likely due to history of present illness and physical exam findings.   {crccopa:27899}  Initial Plan:  ***  ***Screening labs including CBC  and Metabolic panel to evaluate for infectious or metabolic etiology of disease.  ***Urinalysis with reflex culture ordered to evaluate for UTI or relevant urologic/nephrologic pathology.  ***CXR to evaluate for structural/infectious intrathoracic pathology.  {crccardiactesting:32591::EKG to evaluate for cardiac pathology} Objective evaluation as below reviewed   Initial Study Results:   Laboratory  All laboratory results reviewed without evidence of clinically relevant pathology.   ***Exceptions include: ***   ***EKG EKG was reviewed independently. Rate, rhythm, axis, intervals all examined and without medically  relevant abnormality. ST segments without concerns for elevations.    Radiology:  All images reviewed independently. ***Agree with radiology report at this time.   CT CHEST ABDOMEN PELVIS W CONTRAST Result Date: 01/30/2024 EXAM: CT CHEST ABDOMEN PELVIS WITH THORACIC AND LUMBAR SPINE RECONSTRUCTIONS 01/30/2024 11:27:58 AM TECHNIQUE: CT of the chest, abdomen, pelvis was performed after the administration of intravenous contrast. Multiplanar reformatted images are provided for review, including reconstructed images of the thoracic and lumbar spine. Automated exposure control, iterative reconstruction, and/or weight based adjustment of the mA/kV was utilized to reduce the radiation dose to as low as reasonably achievable. COMPARISON: None. CLINICAL HISTORY: Polytrauma, blunt. Fall from roof. FINDINGS: CT CHEST: THORACIC AORTA: No acute traumatic injury of the aorta. MEDIASTINUM: No mediastinal hematoma or pneumomediastinum. Pericardial fluid. No acute traumatic injury to the heart. The central airways are clear. Esophagus is normal. LUNGS: No acute traumatic injury to the lungs. No pulmonary contusion or laceration. No pneumothorax. No pleural effusion. CHEST WALL: No acute displaced rib fracture. No chest wall hematoma. CT ABDOMEN AND PELVIS: ABDOMINAL AORTA: No acute traumatic injury of the aorta or iliac arteries. HEPATOBILIARY: No acute traumatic injury. SPLEEN: No acute traumatic injury. PANCREAS: No acute traumatic injury. ADRENAL GLANDS: No acute traumatic injury. KIDNEYS: No acute traumatic injury. Kidneys enhance symmetrically. For the left kidney, there is a 4 cm rounded lesion with low attenuation (HU equal 25). Patient's arms are on the right side, which can increase attenuation. Favor bosniak 1 or bosniak 2 cyst. No follow-up recommended for benign renal lesion GI TRACT: No acute traumatic injury of the bowel. No bowel obstruction. PERITONEUM: No ascites or free air. No mesenteric fluid.  RETROPERITONEUM: No retroperitoneal hematoma. BLADDER: No acute abnormality. Bladder is intact. REPRODUCTIVE ORGANS: No acute abnormality. BONES: No acute traumatic fracture of the pelvis. No ectopelvic fracture. THORACIC AND LUMBAR SPINE: BONES AND ALIGNMENT: There is a burst type fracture through the T11-body. A diagonal fracture extends from the anterior superior endplate to the posterior inferior endplate, seen on image 76 of series 7. 1 1 mm of retropulsion is noted on series 78 The fracture plane approaches the right pedicle. No traumatic malalignment. DEGENERATIVE CHANGES: No severe spinal canal stenosis or bony neural foraminal narrowing. SOFT TISSUES: No paraspinal mass or hematoma. IMPRESSION: 1. Burst type fracture through the T11 vertebral body with 1 mm of retropulsion. Fracture approaching the right pedicle. Recommend spine surgery consultation for potential unstable fracture. 2. No additional evidence of trauma on chest and abdomen and pelvis Electronically signed by: Norleen Boxer MD 01/30/2024 12:30 PM EST RP Workstation: [id number]   DG Chest Port 1 View Result Date: 01/30/2024 EXAM: 1 VIEW(S) XRAY OF THE CHEST 01/30/2024 10:29:00 AM COMPARISON: 01/23/2023 CLINICAL HISTORY: Trauma FINDINGS: LUNGS AND PLEURA: Similar atelectasis versus scarring at the left lung base. Interstitial prominence suggestive of COPD. No pleural effusion. No pneumothorax. HEART AND MEDIASTINUM: Aortic atherosclerosis. Stable appearance of the cardiomediastinal silhouette. BONES AND SOFT TISSUES: The clavicles are not visualized. No acute  osseous abnormality. LIMITATIONS/ARTIFACTS: The neck is not imaged. IMPRESSION: 1. No acute cardiopulmonary findings. 2. Similar left basilar atelectasis versus scarring. 3. Interstitial prominence suggestive of COPD. Electronically signed by: Donnice Mania MD 01/30/2024 11:25 AM EST RP Workstation: [id number]   CT CERVICAL SPINE WO CONTRAST Result Date: 01/30/2024 EXAM: CT CERVICAL  SPINE WITHOUT CONTRAST 01/30/2024 10:55:00 AM TECHNIQUE: CT of the cervical spine was performed without the administration of intravenous contrast. Multiplanar reformatted images are provided for review. Automated exposure control, iterative reconstruction, and/or weight based adjustment of the mA/kV was utilized to reduce the radiation dose to as low as reasonably achievable. COMPARISON: Cervical spine radiographs S23 18. CLINICAL HISTORY: Polytrauma, blunt. FINDINGS: CERVICAL SPINE: BONES AND ALIGNMENT: Straightening of the normal cervical lordosis is stable. Solid anterior fusion is present at C6-C7. No acute fracture or traumatic malalignment. DEGENERATIVE CHANGES: Chronic loss of disc height and adjacent level disease is present at C5-C6 with moderate foraminal stenosis bilaterally. Uncovertebral disease contributes to moderate foraminal narrowing bilaterally at C4-C5. 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 SOFT TISSUES: No prevertebral soft tissue swelling. IMPRESSION: 1. No acute abnormality of the cervical spine. 2. Stable straightening of the normal cervical lordosis. 3. Solid anterior fusion at C6-7. 4. Chronic loss of disc height and adjacent level disease at C5-6 with moderate foraminal stenosis bilaterally. 5. Uncovertebral disease at C4-5 contributing to moderate foraminal narrowing bilaterally. 6. 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 Electronically signed by: Lonni Necessary MD 01/30/2024 11:24 AM EST RP Workstation: [id number]   DG Pelvis Portable Result Date: 01/30/2024 CLINICAL DATA:  Trauma.  Fall. EXAM: DG PORTABLE PELVIS COMPARISON:  Radiographs 05/15/2023. FINDINGS: The mineralization and alignment are normal. There is no evidence of acute fracture or dislocation. Stable mild degenerative changes of both hips. The sacroiliac joints and symphysis pubis are  intact. Mild lower lumbar spondylosis. No significant soft tissue abnormalities are seen. IMPRESSION: No evidence of acute pelvic fracture or dislocation. Mild degenerative changes. Electronically Signed   By: Elsie Perone M.D.   On: 01/30/2024 11:22   CT HEAD WO CONTRAST Result Date: 01/30/2024 EXAM: CT HEAD WITHOUT CONTRAST 01/30/2024 10:55:00 AM TECHNIQUE: CT of the head was performed without the administration of intravenous contrast. Automated exposure control, iterative reconstruction, and/or weight based adjustment of the mA/kV was utilized to reduce the radiation dose to as low as reasonably achievable. COMPARISON: None available. CLINICAL HISTORY: Head trauma, moderate-severe. Fall from ladder with trauma to head. FINDINGS: BRAIN AND VENTRICLES: No acute hemorrhage. No evidence of acute infarct. No hydrocephalus. No extra-axial collection. No mass effect or midline shift. ORBITS: No acute abnormality. SINUSES: Small bilateral mastoid effusions. SOFT TISSUES AND SKULL: No acute soft tissue abnormality. No skull fracture. IMPRESSION: 1. No acute intracranial abnormality. 2. Small bilateral mastoid effusions. Electronically signed by: Lonni Necessary MD 01/30/2024 11:21 AM EST RP Workstation: [id number]      Consults: Case discussed with ***.   Reassessment and Plan:   ***    ***  Clinical Impression: No diagnosis found.   Data Unavailable   Final Clinical Impression(s) / ED Diagnoses Final diagnoses:  None    Rx / DC Orders ED Discharge Orders     None

## 2024-02-10 ENCOUNTER — Other Ambulatory Visit: Payer: Self-pay | Admitting: Neurosurgery

## 2024-02-12 ENCOUNTER — Other Ambulatory Visit: Payer: Self-pay

## 2024-02-12 ENCOUNTER — Encounter (HOSPITAL_COMMUNITY): Payer: Self-pay | Admitting: Neurosurgery

## 2024-02-12 NOTE — Progress Notes (Signed)
 SDW call  Patient was given pre-op instructions over the phone. Patient verbalized understanding of instructions provided.  Denies any SOB, fever or cough   PCP - [facility] Cardiologist - Dr. Dorn Ross Pulmonary:    PPM/ICD - denies Device Orders - na Rep Notified - na   Chest x-ray - 01/30/2024 EKG -  DOS, 02/13/2024 Stress Test -03/23/2016 ECHO - 04/09/2014 Cardiac Cath - 03/2016 PFT: 04/26/2014  Sleep Study/sleep apnea/CPAP: OSA with CPAP  Type II diabetic. States last A1C 10.0 at [facility] Fasting Blood sugar range: 160-170 How often check sugars: Weekly Metformin, hold DOS Farxiga, states last dose 02/11/2024   Blood Thinner Instructions: denies Aspirin  Instructions: States last dose 02/10/2024   ERAS Protcol - NPO   Anesthesia review: Yes. OSA w/CPAP, HTN, DM, COPD  Your procedure is scheduled on Thursday February 13, 2024  Report to [facility] Main Entrance A at  1045  A.M., then check in with the Admitting office.  Call this number if you have problems the morning of surgery:  [phone]   If you have any questions prior to your surgery date call [phone]: Open Monday-Friday 8am-4pm If you experience any cold or flu symptoms such as cough, fever, chills, shortness of breath, etc. between now and your scheduled surgery, please notify us  at the above number    Remember:  Do not eat or drink after midnight the night before your surgery  Take these medicines the morning of surgery with A SIP OF WATER :  Anoro ellipta, allegra, prozac, flonase , metoprolol, singulair, prilosec, flomax   As needed: Albuterol , flexeril , oxycodone   As of today, STOP taking any Aspirin  (unless otherwise instructed by your surgeon) Aleve, Naproxen, Ibuprofen , Motrin , Advil , Goody's, BC's, all herbal medications, fish oil, and all vitamins.

## 2024-02-13 ENCOUNTER — Inpatient Hospital Stay (HOSPITAL_COMMUNITY): Payer: Self-pay | Admitting: Physician Assistant

## 2024-02-13 ENCOUNTER — Observation Stay (HOSPITAL_COMMUNITY)
Admission: RE | Admit: 2024-02-13 | Discharge: 2024-02-19 | DRG: 520 | Disposition: A | Attending: Neurosurgery | Admitting: Neurosurgery

## 2024-02-13 ENCOUNTER — Encounter (HOSPITAL_COMMUNITY): Admission: RE | Disposition: A | Payer: Self-pay | Source: Home / Self Care | Attending: Neurosurgery

## 2024-02-13 ENCOUNTER — Encounter (HOSPITAL_COMMUNITY): Payer: Self-pay | Admitting: Neurosurgery

## 2024-02-13 ENCOUNTER — Ambulatory Visit (HOSPITAL_COMMUNITY)

## 2024-02-13 DIAGNOSIS — S22082K Unstable burst fracture of T11-T12 vertebra, subsequent encounter for fracture with nonunion: Secondary | ICD-10-CM | POA: Diagnosis present

## 2024-02-13 DIAGNOSIS — S22000A Wedge compression fracture of unspecified thoracic vertebra, initial encounter for closed fracture: Principal | ICD-10-CM | POA: Diagnosis present

## 2024-02-13 LAB — GLUCOSE, CAPILLARY
Glucose-Capillary: 127 mg/dL — ABNORMAL HIGH (ref 70–99)
Glucose-Capillary: 120 mg/dL — ABNORMAL HIGH (ref 70–99)
Glucose-Capillary: 190 mg/dL — ABNORMAL HIGH (ref 70–99)
Glucose-Capillary: 185 mg/dL — ABNORMAL HIGH (ref 70–99)

## 2024-02-13 LAB — CBC
HCT: 40.6 % (ref 39.0–52.0)
Hemoglobin: 13.3 g/dL (ref 13.0–17.0)
MCH: 28.9 pg (ref 26.0–34.0)
MCHC: 32.8 g/dL (ref 30.0–36.0)
MCV: 88.3 fL (ref 80.0–100.0)
Platelets: 357 K/uL (ref 150–400)
RBC: 4.6 MIL/uL (ref 4.22–5.81)
RDW: 13.1 % (ref 11.5–15.5)
WBC: 14 K/uL — ABNORMAL HIGH (ref 4.0–10.5)
nRBC: 0 % (ref 0.0–0.2)

## 2024-02-13 LAB — TYPE AND SCREEN
ABO/RH(D): O POS
Antibody Screen: NEGATIVE

## 2024-02-13 LAB — SURGICAL PCR SCREEN
MRSA, PCR: NEGATIVE
Staphylococcus aureus: NEGATIVE

## 2024-02-13 LAB — ABO/RH: ABO/RH(D): O POS

## 2024-02-13 LAB — CREATININE, SERUM
Creatinine, Ser: 0.91 mg/dL (ref 0.61–1.24)
GFR, Estimated: >60 mL/min (ref >60)

## 2024-02-13 SURGERY — THORACIC FUSION 2 LEVEL
Anesthesia: General | Site: Spine Thoracic

## 2024-02-13 MED ORDER — ACETAMINOPHEN 325 MG PO TABS: 650.0000 mg | ORAL_TABLET | ORAL | Status: DC | PRN

## 2024-02-13 MED ORDER — THROMBIN 20000 UNITS EX SOLR
CUTANEOUS | Status: DC | PRN
  Administered 2024-02-13: 20 mL via TOPICAL

## 2024-02-13 MED ORDER — SODIUM CHLORIDE 0.9% FLUSH
3.0000 mL | Freq: Two times a day (BID) | INTRAVENOUS | Status: DC
  Administered 2024-02-14 – 2024-02-18 (×8): 3 mL via INTRAVENOUS

## 2024-02-13 MED ORDER — METFORMIN HCL 500 MG PO TABS
1000.0000 mg | ORAL_TABLET | Freq: Two times a day (BID) | ORAL | Status: DC
  Administered 2024-02-14 – 2024-02-18 (×10): 1000 mg via ORAL
  Filled 2024-02-13 (×8): qty 2

## 2024-02-13 MED ORDER — HYDROMORPHONE HCL 1 MG/ML IJ SOLN
INTRAMUSCULAR | Status: DC | PRN
  Administered 2024-02-13 (×2): .25 mg via INTRAVENOUS

## 2024-02-13 MED ORDER — OXYCODONE HCL 5 MG PO TABS
5.0000 mg | ORAL_TABLET | ORAL | Status: DC | PRN
  Administered 2024-02-15 – 2024-02-16 (×2): 5 mg via ORAL
  Filled 2024-02-13: qty 1

## 2024-02-13 MED ORDER — MENTHOL 3 MG MT LOZG: 1.0000 | LOZENGE | OROMUCOSAL | Status: DC | PRN

## 2024-02-13 MED ORDER — INSULIN ASPART 100 UNIT/ML IJ SOLN: 0.0000 [IU] | INTRAMUSCULAR | Status: DC | PRN

## 2024-02-13 MED ORDER — ACETAMINOPHEN 325 MG PO TABS
650.0000 mg | ORAL_TABLET | ORAL | Status: DC | PRN
  Administered 2024-02-14 – 2024-02-16 (×3): 650 mg via ORAL
  Filled 2024-02-13 (×2): qty 2

## 2024-02-13 MED ORDER — ALUM & MAG HYDROXIDE-SIMETH 200-200-20 MG/5ML PO SUSP
30.0000 mL | Freq: Four times a day (QID) | ORAL | Status: DC | PRN
  Filled 2024-02-13: qty 30

## 2024-02-13 MED ORDER — POTASSIUM CHLORIDE IN NACL 20-0.9 MEQ/L-% IV SOLN: INTRAVENOUS | Status: DC

## 2024-02-13 MED ORDER — VANCOMYCIN HCL IN DEXTROSE 1-5 GM/200ML-% IV SOLN
1000.0000 mg | Freq: Once | INTRAVENOUS | Status: AC
  Administered 2024-02-13: 1000 mg via INTRAVENOUS

## 2024-02-13 MED ORDER — DEXAMETHASONE SOD PHOSPHATE PF 10 MG/ML IJ SOLN
INTRAMUSCULAR | Status: DC | PRN
  Administered 2024-02-13: 10 mg via INTRAVENOUS

## 2024-02-13 MED ORDER — ASPIRIN 81 MG PO TBEC
81.0000 mg | DELAYED_RELEASE_TABLET | Freq: Every day | ORAL | Status: DC
  Administered 2024-02-13 – 2024-02-18 (×6): 81 mg via ORAL
  Filled 2024-02-13 (×3): qty 1

## 2024-02-13 MED ORDER — HYDROMORPHONE HCL 1 MG/ML IJ SOLN
INTRAMUSCULAR | Status: AC
  Filled 2024-02-13: qty 0.5

## 2024-02-13 MED ORDER — OXYCODONE HCL 5 MG/5ML PO SOLN: 5.0000 mg | Freq: Once | ORAL | Status: DC | PRN

## 2024-02-13 MED ORDER — ROCURONIUM BROMIDE 10 MG/ML (PF) SYRINGE: PREFILLED_SYRINGE | INTRAVENOUS | Status: AC

## 2024-02-13 MED ORDER — MONTELUKAST SODIUM 10 MG PO TABS
10.0000 mg | ORAL_TABLET | Freq: Every day | ORAL | Status: DC
  Administered 2024-02-14 – 2024-02-18 (×5): 10 mg via ORAL
  Filled 2024-02-13 (×3): qty 1

## 2024-02-13 MED ORDER — PHENYLEPHRINE 80 MCG/ML (10ML) SYRINGE FOR IV PUSH (FOR BLOOD PRESSURE SUPPORT): PREFILLED_SYRINGE | INTRAVENOUS | Status: AC

## 2024-02-13 MED ORDER — CHLORHEXIDINE GLUCONATE 0.12 % MT SOLN
OROMUCOSAL | Status: AC
  Administered 2024-02-13: 15 mL via OROMUCOSAL

## 2024-02-13 MED ORDER — ACETAMINOPHEN 500 MG PO TABS
1000.0000 mg | ORAL_TABLET | Freq: Once | ORAL | Status: AC
  Administered 2024-02-13: 1000 mg via ORAL
  Filled 2024-02-13: qty 2

## 2024-02-13 MED ORDER — ROSUVASTATIN CALCIUM 5 MG PO TABS
5.0000 mg | ORAL_TABLET | Freq: Every day | ORAL | Status: DC
  Administered 2024-02-13 – 2024-02-18 (×6): 5 mg via ORAL
  Filled 2024-02-13: qty 1

## 2024-02-13 MED ORDER — FENTANYL CITRATE (PF) 250 MCG/5ML IJ SOLN
INTRAMUSCULAR | Status: DC | PRN
  Administered 2024-02-13 (×5): 50 ug via INTRAVENOUS

## 2024-02-13 MED ORDER — POTASSIUM CHLORIDE IN NACL 20-0.9 MEQ/L-% IV SOLN
INTRAVENOUS | Status: DC
  Filled 2024-02-13: qty 1000

## 2024-02-13 MED ORDER — ONDANSETRON HCL 4 MG PO TABS: 4.0000 mg | ORAL_TABLET | Freq: Four times a day (QID) | ORAL | Status: DC | PRN

## 2024-02-13 MED ORDER — ONDANSETRON HCL 4 MG/2ML IJ SOLN: 4.0000 mg | Freq: Four times a day (QID) | INTRAMUSCULAR | Status: DC | PRN

## 2024-02-13 MED ORDER — FLUOXETINE HCL 20 MG PO CAPS
20.0000 mg | ORAL_CAPSULE | Freq: Every day | ORAL | Status: DC
  Administered 2024-02-14 – 2024-02-18 (×5): 20 mg via ORAL
  Filled 2024-02-13 (×4): qty 1

## 2024-02-13 MED ORDER — FENTANYL CITRATE (PF) 250 MCG/5ML IJ SOLN: INTRAMUSCULAR | Status: AC

## 2024-02-13 MED ORDER — ORAL CARE MOUTH RINSE: 15.0000 mL | Freq: Once | OROMUCOSAL | Status: AC

## 2024-02-13 MED ORDER — PROPOFOL 10 MG/ML IV BOLUS
INTRAVENOUS | Status: DC | PRN
  Administered 2024-02-13: 150 mg via INTRAVENOUS

## 2024-02-13 MED ORDER — SODIUM CHLORIDE 0.9% FLUSH: 3.0000 mL | INTRAVENOUS | Status: DC | PRN

## 2024-02-13 MED ORDER — OXYCODONE HCL ER 10 MG PO T12A
10.0000 mg | EXTENDED_RELEASE_TABLET | Freq: Two times a day (BID) | ORAL | Status: DC
  Administered 2024-02-13 – 2024-02-18 (×11): 10 mg via ORAL
  Filled 2024-02-13 (×6): qty 1

## 2024-02-13 MED ORDER — OXYCODONE HCL 5 MG PO TABS
10.0000 mg | ORAL_TABLET | ORAL | Status: DC | PRN
  Administered 2024-02-14 – 2024-02-18 (×17): 10 mg via ORAL
  Filled 2024-02-13 (×7): qty 2

## 2024-02-13 MED ORDER — AMISULPRIDE (ANTIEMETIC) 5 MG/2ML IV SOLN: 10.0000 mg | Freq: Once | INTRAVENOUS | Status: DC | PRN

## 2024-02-13 MED ORDER — DIAZEPAM 5 MG PO TABS: 5.0000 mg | ORAL_TABLET | Freq: Four times a day (QID) | ORAL | Status: DC | PRN

## 2024-02-13 MED ORDER — LIDOCAINE 2% (20 MG/ML) 5 ML SYRINGE
INTRAMUSCULAR | Status: AC
  Filled 2024-02-13: qty 5

## 2024-02-13 MED ORDER — KETOROLAC TROMETHAMINE 15 MG/ML IJ SOLN
15.0000 mg | Freq: Four times a day (QID) | INTRAMUSCULAR | Status: AC
  Administered 2024-02-14 (×4): 15 mg via INTRAVENOUS
  Filled 2024-02-13 (×3): qty 1

## 2024-02-13 MED ORDER — MORPHINE SULFATE (PF) 2 MG/ML IV SOLN
2.0000 mg | INTRAVENOUS | Status: DC | PRN
  Administered 2024-02-15 – 2024-02-17 (×3): 2 mg via INTRAVENOUS

## 2024-02-13 MED ORDER — SUGAMMADEX SODIUM 200 MG/2ML IV SOLN
INTRAVENOUS | Status: DC | PRN
  Administered 2024-02-13: 300 mg via INTRAVENOUS

## 2024-02-13 MED ORDER — DAPAGLIFLOZIN PROPANEDIOL 10 MG PO TABS
10.0000 mg | ORAL_TABLET | Freq: Every day | ORAL | Status: DC
  Administered 2024-02-13 – 2024-02-18 (×6): 10 mg via ORAL
  Filled 2024-02-13 (×3): qty 1

## 2024-02-13 MED ORDER — OXYCODONE HCL 5 MG PO TABS: 10.0000 mg | ORAL_TABLET | ORAL | Status: DC | PRN

## 2024-02-13 MED ORDER — PROPOFOL 10 MG/ML IV BOLUS
INTRAVENOUS | Status: AC
  Filled 2024-02-13: qty 20

## 2024-02-13 MED ORDER — HYDROMORPHONE HCL 1 MG/ML IJ SOLN
INTRAMUSCULAR | Status: AC
  Filled 2024-02-13: qty 1

## 2024-02-13 MED ORDER — DIAZEPAM 5 MG PO TABS
5.0000 mg | ORAL_TABLET | Freq: Four times a day (QID) | ORAL | Status: DC | PRN
  Administered 2024-02-14 – 2024-02-15 (×3): 5 mg via ORAL
  Filled 2024-02-13: qty 1

## 2024-02-13 MED ORDER — HYDROMORPHONE HCL 1 MG/ML IJ SOLN
0.2500 mg | INTRAMUSCULAR | Status: DC | PRN
  Administered 2024-02-13 (×3): 0.5 mg via INTRAVENOUS

## 2024-02-13 MED ORDER — DEXMEDETOMIDINE HCL IN NACL 80 MCG/20ML IV SOLN
INTRAVENOUS | Status: DC | PRN
  Administered 2024-02-13: 8 ug via INTRAVENOUS
  Administered 2024-02-13: 4 ug via INTRAVENOUS

## 2024-02-13 MED ORDER — UMECLIDINIUM-VILANTEROL 62.5-25 MCG/ACT IN AEPB
1.0000 | INHALATION_SPRAY | Freq: Every day | RESPIRATORY_TRACT | Status: DC
  Administered 2024-02-13 – 2024-02-18 (×5): 1 via RESPIRATORY_TRACT
  Filled 2024-02-13: qty 14

## 2024-02-13 MED ORDER — LIDOCAINE-EPINEPHRINE 0.5 %-1:200000 IJ SOLN: INTRAMUSCULAR | Status: AC

## 2024-02-13 MED ORDER — TAMSULOSIN HCL 0.4 MG PO CAPS
0.4000 mg | ORAL_CAPSULE | Freq: Every day | ORAL | Status: DC
  Administered 2024-02-14 – 2024-02-18 (×5): 0.4 mg via ORAL
  Filled 2024-02-13 (×2): qty 1

## 2024-02-13 MED ORDER — PHENOL 1.4 % MT LIQD: 1.0000 | OROMUCOSAL | Status: DC | PRN

## 2024-02-13 MED ORDER — SODIUM CHLORIDE 0.9 % IV SOLN: 12.5000 mg | INTRAVENOUS | Status: DC | PRN

## 2024-02-13 MED ORDER — FENTANYL CITRATE (PF) 100 MCG/2ML IJ SOLN: INTRAMUSCULAR | Status: AC

## 2024-02-13 MED ORDER — ONDANSETRON HCL 4 MG/2ML IJ SOLN
INTRAMUSCULAR | Status: DC | PRN
  Administered 2024-02-13: 4 mg via INTRAVENOUS

## 2024-02-13 MED ORDER — CHLORHEXIDINE GLUCONATE 0.12 % MT SOLN: 15.0000 mL | Freq: Once | OROMUCOSAL | Status: AC

## 2024-02-13 MED ORDER — MIDAZOLAM HCL 2 MG/2ML IJ SOLN: INTRAMUSCULAR | Status: AC

## 2024-02-13 MED ORDER — OXYCODONE HCL 5 MG PO TABS: 5.0000 mg | ORAL_TABLET | Freq: Once | ORAL | Status: DC | PRN

## 2024-02-13 MED ORDER — LIDOCAINE-EPINEPHRINE 0.5 %-1:200000 IJ SOLN
INTRAMUSCULAR | Status: DC | PRN
  Administered 2024-02-13: 20 mL

## 2024-02-13 MED ORDER — LACTATED RINGERS IV SOLN: INTRAVENOUS | Status: DC

## 2024-02-13 MED ORDER — SODIUM CHLORIDE 0.9 % IV SOLN: 250.0000 mL | INTRAVENOUS | Status: AC

## 2024-02-13 MED ORDER — ROCURONIUM BROMIDE 10 MG/ML (PF) SYRINGE
PREFILLED_SYRINGE | INTRAVENOUS | Status: AC
  Filled 2024-02-13: qty 10

## 2024-02-13 MED ORDER — BUPIVACAINE HCL (PF) 0.5 % IJ SOLN: INTRAMUSCULAR | Status: DC | PRN

## 2024-02-13 MED ORDER — ACETAMINOPHEN 650 MG RE SUPP: 650.0000 mg | RECTAL | Status: DC | PRN

## 2024-02-13 MED ORDER — ROCURONIUM BROMIDE 10 MG/ML (PF) SYRINGE
PREFILLED_SYRINGE | INTRAVENOUS | Status: DC | PRN
  Administered 2024-02-13: 10 mg via INTRAVENOUS
  Administered 2024-02-13: 50 mg via INTRAVENOUS
  Administered 2024-02-13: 10 mg via INTRAVENOUS
  Administered 2024-02-13: 30 mg via INTRAVENOUS

## 2024-02-13 MED ORDER — HEPARIN SODIUM (PORCINE) 5000 UNIT/ML IJ SOLN
5000.0000 [IU] | Freq: Three times a day (TID) | INTRAMUSCULAR | Status: DC
  Administered 2024-02-13 – 2024-02-18 (×16): 5000 [IU] via SUBCUTANEOUS
  Filled 2024-02-13 (×11): qty 1

## 2024-02-13 MED ORDER — LORATADINE 10 MG PO TABS
10.0000 mg | ORAL_TABLET | Freq: Every day | ORAL | Status: DC
  Administered 2024-02-14 – 2024-02-18 (×5): 10 mg via ORAL
  Filled 2024-02-13 (×4): qty 1

## 2024-02-13 MED ORDER — EPHEDRINE 5 MG/ML INJ
INTRAVENOUS | Status: AC
  Filled 2024-02-13: qty 5

## 2024-02-13 MED ORDER — MELATONIN 5 MG PO TABS
5.0000 mg | ORAL_TABLET | Freq: Every day | ORAL | Status: DC
  Administered 2024-02-13 – 2024-02-18 (×6): 5 mg via ORAL
  Filled 2024-02-13 (×5): qty 1

## 2024-02-13 MED ORDER — VANCOMYCIN HCL 1000 MG IV SOLR: 1000.0000 mg | Freq: Once | INTRAVENOUS | Status: DC

## 2024-02-13 MED ORDER — THROMBIN 20000 UNITS EX SOLR
CUTANEOUS | Status: AC
  Filled 2024-02-13: qty 20000

## 2024-02-13 MED ORDER — OXYCODONE HCL 5 MG PO TABS: 5.0000 mg | ORAL_TABLET | ORAL | Status: DC | PRN

## 2024-02-13 MED ORDER — PANTOPRAZOLE SODIUM 40 MG PO TBEC
40.0000 mg | DELAYED_RELEASE_TABLET | Freq: Every day | ORAL | Status: DC
  Administered 2024-02-13 – 2024-02-18 (×6): 40 mg via ORAL
  Filled 2024-02-13 (×4): qty 1

## 2024-02-13 MED ORDER — BUPIVACAINE HCL (PF) 0.5 % IJ SOLN
INTRAMUSCULAR | Status: AC
  Filled 2024-02-13: qty 30

## 2024-02-13 MED ORDER — VANCOMYCIN HCL IN DEXTROSE 1-5 GM/200ML-% IV SOLN: INTRAVENOUS | Status: AC

## 2024-02-13 MED ORDER — METOPROLOL TARTRATE 25 MG PO TABS
25.0000 mg | ORAL_TABLET | Freq: Two times a day (BID) | ORAL | Status: DC
  Administered 2024-02-13 – 2024-02-18 (×11): 25 mg via ORAL
  Filled 2024-02-13 (×8): qty 1

## 2024-02-13 MED ORDER — SODIUM CHLORIDE 0.9% FLUSH
3.0000 mL | Freq: Two times a day (BID) | INTRAVENOUS | Status: DC
  Administered 2024-02-13 – 2024-02-18 (×8): 3 mL via INTRAVENOUS

## 2024-02-13 MED ORDER — FLUTICASONE PROPIONATE 50 MCG/ACT NA SUSP
2.0000 | Freq: Every day | NASAL | Status: DC
  Administered 2024-02-14 – 2024-02-18 (×5): 2 via NASAL
  Filled 2024-02-13: qty 16

## 2024-02-13 MED ORDER — ZOLPIDEM TARTRATE 5 MG PO TABS
5.0000 mg | ORAL_TABLET | Freq: Every evening | ORAL | Status: DC | PRN
  Administered 2024-02-15: 5 mg via ORAL
  Filled 2024-02-13: qty 1

## 2024-02-13 MED ORDER — MIDAZOLAM HCL (PF) 2 MG/2ML IJ SOLN
INTRAMUSCULAR | Status: DC | PRN
  Administered 2024-02-13: 2 mg via INTRAVENOUS

## 2024-02-13 MED ORDER — FENTANYL CITRATE (PF) 100 MCG/2ML IJ SOLN
25.0000 ug | INTRAMUSCULAR | Status: DC | PRN
  Administered 2024-02-13 (×3): 50 ug via INTRAVENOUS

## 2024-02-13 MED ORDER — LIDOCAINE 2% (20 MG/ML) 5 ML SYRINGE
INTRAMUSCULAR | Status: DC | PRN
  Administered 2024-02-13: 60 mg via INTRAVENOUS

## 2024-02-13 MED ORDER — ONDANSETRON HCL 4 MG/2ML IJ SOLN: INTRAMUSCULAR | Status: AC

## 2024-02-13 MED ORDER — OXYCODONE HCL ER 10 MG PO T12A: 10.0000 mg | EXTENDED_RELEASE_TABLET | Freq: Two times a day (BID) | ORAL | Status: DC

## 2024-02-13 MED ORDER — CELECOXIB 200 MG PO CAPS
200.0000 mg | ORAL_CAPSULE | Freq: Two times a day (BID) | ORAL | Status: DC
  Administered 2024-02-13 – 2024-02-18 (×11): 200 mg via ORAL
  Filled 2024-02-13 (×9): qty 1

## 2024-02-13 MED ORDER — ONDANSETRON HCL 4 MG PO TABS
4.0000 mg | ORAL_TABLET | Freq: Four times a day (QID) | ORAL | Status: DC | PRN
  Administered 2024-02-14 – 2024-02-17 (×3): 4 mg via ORAL
  Filled 2024-02-13 (×3): qty 1

## 2024-02-13 MED ORDER — LISINOPRIL 10 MG PO TABS
10.0000 mg | ORAL_TABLET | Freq: Every day | ORAL | Status: DC
  Administered 2024-02-14 – 2024-02-18 (×5): 10 mg via ORAL
  Filled 2024-02-13 (×4): qty 1

## 2024-02-13 MED ORDER — ALBUTEROL SULFATE (2.5 MG/3ML) 0.083% IN NEBU: 2.5000 mg | INHALATION_SOLUTION | Freq: Four times a day (QID) | RESPIRATORY_TRACT | Status: DC | PRN

## 2024-02-13 MED ORDER — 0.9 % SODIUM CHLORIDE (POUR BTL) OPTIME
TOPICAL | Status: DC | PRN
  Administered 2024-02-13: 1000 mL

## 2024-02-13 MED ORDER — ADULT MULTIVITAMIN W/MINERALS CH
1.0000 | ORAL_TABLET | Freq: Every day | ORAL | Status: DC
  Administered 2024-02-13 – 2024-02-18 (×6): 1 via ORAL
  Filled 2024-02-13 (×5): qty 1

## 2024-02-13 SURGICAL SUPPLY — 50 items
BAG COUNTER SPONGE SURGICOUNT (BAG) ×1 IMPLANT
BASKET BONE COLLECTION (BASKET) ×1 IMPLANT
BENZOIN TINCTURE PRP APPL 2/3 (GAUZE/BANDAGES/DRESSINGS) IMPLANT
BLADE BONE MILL MEDIUM (MISCELLANEOUS) ×1 IMPLANT
BLADE CLIPPER SURG (BLADE) IMPLANT
BUR MATCHSTICK NEURO 3.0 LAGG (BURR) ×1 IMPLANT
BUR PRECISION FLUTE 5.0 (BURR) ×1 IMPLANT
CANISTER SUCTION 3000ML PPV (SUCTIONS) ×1 IMPLANT
CNTNR URN SCR LID CUP LEK RST (MISCELLANEOUS) ×1 IMPLANT
COVER BACK TABLE 60X90IN (DRAPES) IMPLANT
DERMABOND ADVANCED .7 DNX12 (GAUZE/BANDAGES/DRESSINGS) ×1 IMPLANT
DRAPE C-ARM 42X72 X-RAY (DRAPES) ×2 IMPLANT
DRAPE C-ARMOR (DRAPES) IMPLANT
DRAPE LAPAROTOMY 100X72X124 (DRAPES) ×1 IMPLANT
DRAPE SURG 17X23 STRL (DRAPES) ×1 IMPLANT
DRSG OPSITE POSTOP 4X8 (GAUZE/BANDAGES/DRESSINGS) IMPLANT
DURAPREP 26ML APPLICATOR (WOUND CARE) ×1 IMPLANT
ELECTRODE REM PT RTRN 9FT ADLT (ELECTROSURGICAL) ×1 IMPLANT
GAUZE 4X4 16PLY ~~LOC~~+RFID DBL (SPONGE) IMPLANT
GAUZE SPONGE 4X4 12PLY STRL (GAUZE/BANDAGES/DRESSINGS) IMPLANT
GLOVE BIOGEL PI MICRO STRL 6.5 (GLOVE) ×2 IMPLANT
GOWN STRL REUS W/ TWL LRG LVL3 (GOWN DISPOSABLE) ×2 IMPLANT
GOWN STRL REUS W/ TWL XL LVL3 (GOWN DISPOSABLE) IMPLANT
GOWN STRL REUS W/TWL 2XL LVL3 (GOWN DISPOSABLE) IMPLANT
GUIDEWIRE NITINOL BEVEL TIP (WIRE) IMPLANT
KIT BASIN OR (CUSTOM PROCEDURE TRAY) ×1 IMPLANT
KIT POSITIONER JACKSON TABLE (MISCELLANEOUS) ×1 IMPLANT
KIT TURNOVER KIT B (KITS) ×1 IMPLANT
MILL BONE PREP (MISCELLANEOUS) IMPLANT
NDL HYPO 25X1 1.5 SAFETY (NEEDLE) ×1 IMPLANT
NDL I-PASS III (NEEDLE) IMPLANT
NDL SPNL 18GX3.5 QUINCKE PK (NEEDLE) IMPLANT
PACK LAMINECTOMY NEURO (CUSTOM PROCEDURE TRAY) ×1 IMPLANT
PAD ARMBOARD POSITIONER FOAM (MISCELLANEOUS) ×3 IMPLANT
ROD RELINE MAS LORD 5.5X85MM (Rod) IMPLANT
ROD RELINE MAS LORD 5.5X90MM (Rod) IMPLANT
SCREW LOCK RELINE 5.5 TULIP (Screw) IMPLANT
SCREW RELINE MAS POLY 6.5X40MM (Screw) IMPLANT
SOLN 0.9% NACL POUR BTL 1000ML (IV SOLUTION) ×1 IMPLANT
SOLN STERILE WATER BTL 1000 ML (IV SOLUTION) ×1 IMPLANT
SPIKE FLUID TRANSFER (MISCELLANEOUS) ×1 IMPLANT
SPONGE SURGIFOAM ABS GEL 100 (HEMOSTASIS) ×1 IMPLANT
SPONGE T-LAP 4X18 ~~LOC~~+RFID (SPONGE) IMPLANT
STRIP CLOSURE SKIN 1/2X4 (GAUZE/BANDAGES/DRESSINGS) IMPLANT
SUT PROLENE 6 0 BV (SUTURE) IMPLANT
SUT VIC AB 0 CT1 18XCR BRD8 (SUTURE) ×1 IMPLANT
SUT VIC AB 2-0 CT1 18 (SUTURE) ×1 IMPLANT
SUT VIC AB 3-0 SH 8-18 (SUTURE) ×1 IMPLANT
TOWEL GREEN STERILE (TOWEL DISPOSABLE) ×1 IMPLANT
TOWEL GREEN STERILE FF (TOWEL DISPOSABLE) ×1 IMPLANT

## 2024-02-13 NOTE — Transfer of Care (Signed)
 Immediate Anesthesia Transfer of Care Note  Patient: Patrick Hardy  Procedure(s) Performed: OPEN REDUCTION INTERNAL FIXATION THORACIC TEN TO THORACIC TWELVE (Spine Thoracic)  Patient Location: PACU  Anesthesia Type:General  Level of Consciousness: awake, alert , and oriented  Airway & Oxygen Therapy: Patient Spontanous Breathing and Patient connected to face mask oxygen  Post-op Assessment: Report given to RN and Post -op Vital signs reviewed and stable  Post vital signs: Reviewed and stable  Last Vitals:  Vitals Value Taken Time  BP 142/91 02/13/24 17:30  Temp 36.9 C 02/13/24 17:30  Pulse 80 02/13/24 17:36  Resp 23 02/13/24 17:36  SpO2 95 % 02/13/24 17:36  Vitals shown include unfiled device data.  Last Pain:  Vitals:   02/13/24 1730  TempSrc:   PainSc: 0-No pain         Complications: No notable events documented.

## 2024-02-14 NOTE — Discharge Instructions (Signed)

## 2024-02-14 NOTE — Progress Notes (Signed)
 Pt has arrived to 3w12, ambulated from wheelchair to bed. Alert and oriented x4, VS stable, no signs of acute distress. Pt oriented to room and equipment, instructed to use call bell for assistance, and call bell left within pt reach. Will continue to monitor pt and treat per MD order.

## 2024-02-14 NOTE — Evaluation (Signed)
 Physical Therapy Evaluation Patient Details Name: Patrick Hardy MRN: [id number] DOB: Jun 01, 1960 Today's Date: 02/14/2024  History of Present Illness  63 y/o M admitted to [facility] 12/4 for ORIF with percutaneous pedicle screw placement from T10, and T12. PMHx: s/p 11/24 off roof with T11 fx, COPD, DM, HTN, OSA on CPAP.  Clinical Impression  Prior to falling on 11/24, pt was independent for mobility with no AD. After the fall, pt was limited in mobility and was ambulating short distances with use of RW. Pt was able to recall all spinal precautions with good adherence during session. Educated pt on car transfers, walking program, and progressive mobility with pt verbalizing understanding. Required supervision/CGA for bed mobility and CGA to stand with RW. Pt was able to ambulate 82ft with CGA with distance limited 2/2 fatigue. Anticipate pt will progress well with continued mobility and pain management. Pt has intermittent assist available upon d/c home. Discussed OP PT after follow-up with MD. Acute PT to follow to address functional impairments.       If plan is discharge home, recommend the following: Assist for transportation;Help with stairs or ramp for entrance;A little help with walking and/or transfers;A little help with bathing/dressing/bathroom   Can travel by private vehicle   Yes     Equipment Recommendations None recommended by PT     Functional Status Assessment Patient has had a recent decline in their functional status and demonstrates the ability to make significant improvements in function in a reasonable and predictable amount of time.     Precautions / Restrictions Precautions Precautions: Back;Fall Precaution Booklet Issued: Yes (comment) Recall of Precautions/Restrictions: Intact Precaution/Restrictions Comments: no brace needed Restrictions Weight Bearing Restrictions Per Provider Order: No      Mobility  Bed Mobility Overal bed mobility: Needs Assistance Bed  Mobility: Rolling, Sidelying to Sit, Sit to Sidelying Rolling: Used rails, Supervision Sidelying to sit: Contact guard assist, Used rails    Sit to sidelying: Contact guard assist, Used rails General bed mobility comments: able to recall cues for log roll technique, supervision to roll and CGA for safety for sidelying-sit    Transfers Overall transfer level: Needs assistance Equipment used: Rolling walker (2 wheels) Transfers: Sit to/from Stand Sit to Stand: Contact guard assist    General transfer comment: CGA for safety with cues for hand placement    Ambulation/Gait Ambulation/Gait assistance: Contact guard assist Gait Distance (Feet): 20 Feet Assistive device: Rolling walker (2 wheels) Gait Pattern/deviations: Step-through pattern, Decreased stride length, Wide base of support Gait velocity: decr     General Gait Details: Wide BOS with heavy reliance on UE support. Cues for proximity to RW. Pt limiting distance due to fatigue    Balance Overall balance assessment: Needs assistance Sitting-balance support: Bilateral upper extremity supported, Feet supported Sitting balance-Leahy Scale: Fair Sitting balance - Comments: heavily reliant on BUE support on mattress 2/2 high pain levels (rather than true balance deficit)   Standing balance support: No upper extremity supported Standing balance-Leahy Scale: Fair Standing balance comment: able to stand statically with no UE support, RW for gait        Pertinent Vitals/Pain Pain Assessment Pain Assessment: Faces Faces Pain Scale: Hurts little more Pain Location: back, incision site, R flank/side Pain Descriptors / Indicators: Discomfort, Grimacing, Guarding, Sharp Pain Intervention(s): Monitored during session, Limited activity within patient's tolerance, Repositioned, Patient requesting pain meds-RN notified    Home Living Family/patient expects to be discharged to:: Private residence Living Arrangements: Parent (elderly  mother) Available Help  at Discharge: Family;Available PRN/intermittently (x2 sisters (one is staying with mother while pt is in the hospital, other sister lives down the street from pt) - PRN S/A) Type of Home: House Home Access: Ramped entrance  Home Layout: One level Home Equipment: Agricultural Consultant (2 wheels);Tub bench;Grab bars - tub/shower;Grab bars - toilet      Prior Function Prior Level of Function : Independent/Modified Independent;Driving (retired)    Mobility Comments: was using RW since fall in November ADLs Comments: indep     Extremity/Trunk Assessment   Upper Extremity Assessment Upper Extremity Assessment: Defer to OT evaluation    Lower Extremity Assessment Lower Extremity Assessment: Overall WFL for tasks assessed    Cervical / Trunk Assessment Cervical / Trunk Assessment: Back Surgery  Communication   Communication Communication: No apparent difficulties    Cognition Arousal: Alert Behavior During Therapy: WFL for tasks assessed/performed   PT - Cognitive impairments: No apparent impairments    Following commands: Intact       Cueing Cueing Techniques: Verbal cues, Gestural cues, Tactile cues     General Comments General comments (skin integrity, edema, etc.): VSS; pt wanting to return to side-lying in bed 2/2 pain     PT Assessment Patient needs continued PT services  PT Problem List Decreased activity tolerance;Decreased balance;Decreased mobility;Pain       PT Treatment Interventions DME instruction;Gait training;Stair training;Therapeutic exercise;Therapeutic activities;Functional mobility training;Balance training;Neuromuscular re-education;Patient/family education    PT Goals (Current goals can be found in the Care Plan section)  Acute Rehab PT Goals Patient Stated Goal: to be able to move better PT Goal Formulation: With patient Time For Goal Achievement: 02/28/24 Potential to Achieve Goals: Good    Frequency Min 3X/week         AM-PAC PT 6 Clicks Mobility  Outcome Measure Help needed turning from your back to your side while in a flat bed without using bedrails?: A Little Help needed moving from lying on your back to sitting on the side of a flat bed without using bedrails?: A Little Help needed moving to and from a bed to a chair (including a wheelchair)?: A Little Help needed standing up from a chair using your arms (e.g., wheelchair or bedside chair)?: A Little Help needed to walk in hospital room?: A Little Help needed climbing 3-5 steps with a railing? : A Lot 6 Click Score: 17    End of Session Equipment Utilized During Treatment: Gait belt Activity Tolerance: Patient limited by fatigue;Patient limited by pain Patient left: in bed;with call bell/phone within reach;with bed alarm set Nurse Communication: Mobility status;Patient requests pain meds PT Visit Diagnosis: Unsteadiness on feet (R26.81);Other abnormalities of gait and mobility (R26.89);Pain Pain - part of body:  (Back)    Time: 8864-8841 PT Time Calculation (min) (ACUTE ONLY): 23 min   Charges:   PT Evaluation $PT Eval Low Complexity: 1 Low   PT General Charges $$ ACUTE PT VISIT: 1 Visit    Kate ORN, PT, DPT Secure Chat Preferred  Rehab Office [phone]   Kate BRAVO Wendolyn 02/14/2024, 12:07 PM

## 2024-02-15 MED ORDER — TIZANIDINE HCL 4 MG PO TABS
4.0000 mg | ORAL_TABLET | Freq: Four times a day (QID) | ORAL | Status: DC | PRN
  Administered 2024-02-15 – 2024-02-17 (×8): 4 mg via ORAL
  Filled 2024-02-15 (×5): qty 1

## 2024-02-15 NOTE — Progress Notes (Signed)
 Occupational Therapy Treatment Patient Details Name: Patrick Hardy MRN: [id number] DOB: 1960-10-16 Today's Date: 02/15/2024   History of present illness 63 y/o M admitted to [facility] 12/4 for ORIF with percutaneous pedicle screw placement from T10, and T12. PMHx: s/p 11/24 off roof with T11 fx, COPD, DM, HTN, OSA on CPAP.   OT comments  Pt. Seen for skilled OT treatment session.  Session limited secondary to c/o severe core/R side pain.  Pt. Able to complete sidelying to sit with S.  Sit/stand with MIN A.  Pt. Reports he can not attempt standing grooming task secondary to not being able to take BUEs off of the RW.  Had pt. Take each hand off at a time and he was able to with very limited reach before putting hands back on the RW.  States he is in too much pain and has to lay back down.  Pt. Transitioned from standing to sit with cues for hand placement and sequencing.  Able to transition into sidelying with S.  Cues for relaxation tech. During all positional changes with PLB and cues for shoulder relaxation to relieve tension. Pt. Was receptive to these strategies for relaxing and reducing tension for aide with pain management.  Declines need for A/E reporting he has help available for LB ADL needs at home. Cont. With acute OT POC and attempt to progress ADLs next session as pt. Able.        If plan is discharge home, recommend the following:  A little help with walking and/or transfers;A lot of help with bathing/dressing/bathroom;Assistance with cooking/housework;Assist for transportation;Help with stairs or ramp for entrance   Equipment Recommendations  None recommended by OT    Recommendations for Other Services      Precautions / Restrictions Precautions Precautions: Back;Fall Recall of Precautions/Restrictions: Intact Precaution/Restrictions Comments: no brace needed       Mobility Bed Mobility Overal bed mobility: Needs Assistance Bed Mobility: Sidelying to Sit, Sit to Sidelying    Sidelying to sit: Contact guard assist, Used rails     Sit to sidelying: Contact guard assist, Used rails General bed mobility comments: pt. in sidelying upon arrival to room, cues for no twisting while in sidelying (pt. reporting he had reached back to get urinal, and verbalized he knew he had accidentally twisted).  placed urinal in location that would prevent further twisting.  end of session, sit/sidelying.  able to transition and guide BLEs into bed w/o assistance.    Transfers                         Balance                                           ADL either performed or assessed with clinical judgement   ADL Overall ADL's : Needs assistance/impaired               Lower Body Bathing Details (indicate cue type and reason): pt. reports he can not reach BLEs but declines need for A/E states ill have someone that can do that for me       Lower Body Dressing Details (indicate cue type and reason): pt. reports he can not reach BLEs but declines need for A/E states ill have someone that can do that for me  General ADL Comments: pt. only able to complete bed mobility and sit/stand secondary to c/o pain    Extremity/Trunk Assessment              Vision       Perception     Praxis     Communication Communication Communication: No apparent difficulties   Cognition Arousal: Alert Behavior During Therapy: WFL for tasks assessed/performed Cognition: No apparent impairments                               Following commands: Intact        Cueing   Cueing Techniques: Verbal cues, Gestural cues, Tactile cues  Exercises      Shoulder Instructions       General Comments      Pertinent Vitals/ Pain       Pain Assessment Pain Assessment: Faces Faces Pain Scale: Hurts worst Pain Location: back, incision site, R flank/side Pain Descriptors / Indicators: Discomfort, Grimacing, Guarding,  Sharp Pain Intervention(s): Limited activity within patient's tolerance, Repositioned, Premedicated before session, Monitored during session  Home Living                                          Prior Functioning/Environment              Frequency  Min 2X/week        Progress Toward Goals  OT Goals(current goals can now be found in the care plan section)  Progress towards OT goals: Progressing toward goals     Plan      Co-evaluation                 AM-PAC OT 6 Clicks Daily Activity     Outcome Measure   Help from another person eating meals?: None Help from another person taking care of personal grooming?: A Lot Help from another person toileting, which includes using toliet, bedpan, or urinal?: A Lot Help from another person bathing (including washing, rinsing, drying)?: A Lot Help from another person to put on and taking off regular upper body clothing?: A Little Help from another person to put on and taking off regular lower body clothing?: A Lot 6 Click Score: 15    End of Session Equipment Utilized During Treatment: Gait belt;Rolling walker (2 wheels)  OT Visit Diagnosis: Other abnormalities of gait and mobility (R26.89);Pain   Activity Tolerance Patient limited by pain   Patient Left in bed;with call bell/phone within reach;with bed alarm set   Nurse Communication          Time: 9168-9154 OT Time Calculation (min): 14 min  Charges: OT General Charges $OT Visit: 1 Visit OT Treatments $Self Care/Home Management : 8-22 mins  Randall, COTA/L Acute Rehabilitation [phone]   CHRISTELLA Nest Lorraine-COTA/L  02/15/2024, 9:13 AM

## 2024-02-15 NOTE — Discharge Summary (Signed)
 Physician Discharge Summary  Patient ID: Patrick Hardy MRN: [id number] DOB/AGE: 14-Jan-1961 63 y.o.  Admit date: 02/13/2024 Discharge date: 02/15/2024  Admission Diagnoses:  Thoracic compression fracture  Discharge Diagnoses:  Same Principal Problem:   Thoracic compression fracture [facility]) Active Problems:   Closed unstable burst fracture of eleventh thoracic vertebra with nonunion   Discharged Condition: Stable  Hospital Course:  Patrick Hardy is a 63 y.o. male who is admitted after elective ORIF of T11 compression fracture.  Postoperatively, the patient was mobilized with the help PT, pain was well-controlled with oral meds.  Patient was deemed ready for discharge.   Treatments: Surgery -T10-12 ORIF  Discharge Exam: Blood pressure (!) 135/53, pulse 65, temperature 98.4 F (36.9 C), temperature source Axillary, resp. rate 16, height 6' 2 (1.88 m), weight 128.8 kg, SpO2 97%. Awake, alert, oriented Speech fluent, appropriate CN grossly intact 5/5 BUE/BLE Wound c/d/i  Disposition: Discharge disposition: 01-Home or Self Care        Allergies as of 02/15/2024       Reactions   Penicillins Other (See Comments)   Childhood allergy. Has patient had a PCN reaction causing immediate rash, facial/tongue/throat swelling, SOB or lightheadedness with hypotension: unknown Has patient had a PCN reaction causing severe rash involving mucus membranes or skin necrosis: unknown Has patient had a PCN reaction that required hospitalization: unknown Has patient had a PCN reaction occurring within the last 10 years: unknown Pt has used amoxicillin with no problems         Medication List     TAKE these medications    albuterol  108 (90 Base) MCG/ACT inhaler Commonly known as: VENTOLIN  HFA Inhale 2 puffs into the lungs every 6 (six) hours as needed for wheezing or shortness of breath.   Anoro Ellipta  62.5-25 MCG/ACT Aepb Generic drug: umeclidinium-vilanterol Inhale 1  puff into the lungs daily.   aspirin  EC 81 MG tablet Take 81 mg by mouth daily. Swallow whole.   cyclobenzaprine  10 MG tablet Commonly known as: FLEXERIL  Take 1 tablet (10 mg total) by mouth 2 (two) times daily as needed for muscle spasms. What changed: Another medication with the same name was added. Make sure you understand how and when to take each.   cyclobenzaprine  10 MG tablet Commonly known as: FLEXERIL  Take 1 tablet (10 mg total) by mouth 3 (three) times daily as needed for muscle spasms. What changed: You were already taking a medication with the same name, and this prescription was added. Make sure you understand how and when to take each.   docusate sodium  100 MG capsule Commonly known as: Colace Take 1 capsule (100 mg total) by mouth 2 (two) times daily.   Farxiga  10 MG Tabs tablet Generic drug: dapagliflozin  propanediol Take 10 mg by mouth daily.   fexofenadine 180 MG tablet Commonly known as: ALLEGRA Take 180 mg by mouth daily.   Fish Oil 1000 MG Caps Take 1,000 mg by mouth daily.   FLUoxetine  20 MG capsule Commonly known as: PROZAC  Take 20 mg by mouth daily.   fluticasone  50 MCG/ACT nasal spray Commonly known as: FLONASE  Future refills need to go to pcp,courtesy fill by cardiology, PLACE 2 SPRAYS INTO EACH NOSTRIL DAILY What changed:  how much to take how to take this when to take this additional instructions   lidocaine  5 % Commonly known as: Lidoderm  Place 1 patch onto the skin daily. Remove & Discard patch within 12 hours or as directed by MD What changed:  when  to take this reasons to take this   lisinopril  10 MG tablet Commonly known as: ZESTRIL  TAKE 1 TABLET (10MG ) BY MOUTH TWICE DAILY. PLEASE MAKE APPOINTMENT.   melatonin 5 MG Tabs Take 5 mg by mouth at bedtime.   metFORMIN  500 MG tablet Commonly known as: GLUCOPHAGE  Take 1,000 mg by mouth 2 (two) times daily with a meal.   metoprolol  tartrate 25 MG tablet Commonly known as:  LOPRESSOR  TAKE 1 TABLET (25MG ) BY MOUTH TWICE DAILY. PLEASE MAKE APPOINTMENT.   montelukast  10 MG tablet Commonly known as: SINGULAIR  Take 10 mg by mouth daily.   multivitamin with minerals tablet Take 1 tablet by mouth daily. Men 50+   omeprazole  20 MG capsule Commonly known as: PRILOSEC Take 1 capsule (20 mg total) by mouth daily.   ondansetron  4 MG disintegrating tablet Commonly known as: ZOFRAN -ODT Take 1 tablet (4 mg total) by mouth every 8 (eight) hours as needed for nausea or vomiting.   oxyCODONE -acetaminophen  5-325 MG tablet Commonly known as: PERCOCET/ROXICET Take 1 tablet by mouth every 4 (four) hours as needed for severe pain (pain score 7-10). What changed: Another medication with the same name was added. Make sure you understand how and when to take each.   oxyCODONE -acetaminophen  5-325 MG tablet Commonly known as: Percocet Take 1-2 tablets by mouth every 6 (six) hours as needed for severe pain (pain score 7-10). What changed: You were already taking a medication with the same name, and this prescription was added. Make sure you understand how and when to take each.   rosuvastatin  5 MG tablet Commonly known as: CRESTOR  Take 5 mg by mouth at bedtime.   tamsulosin  0.4 MG Caps capsule Commonly known as: FLOMAX  Take 1 capsule (0.4 mg total) by mouth daily.        Follow-up Information     [facility] Follow up.   Why: Call to set up a primary care doctor        Patrick Duncans, MD Follow up.   Specialty: Neurosurgery Why: keep your scheduled appointment Contact information: [address] [place] [address] [phone]                 Signed: Dorn KANDICE Ned 02/15/2024, 10:46 AM

## 2024-02-15 NOTE — Progress Notes (Signed)
 Physical Therapy Treatment Patient Details Name: Patrick Hardy MRN: [id number] DOB: [birth year] Today's Date: 02/15/2024   History of Present Illness 63 y/o M admitted to [facility] 12/4 for ORIF with percutaneous pedicle screw placement from T10, and T12. PMHx: s/p 11/24 off roof with T11 fx, COPD, DM, HTN, OSA on CPAP.    Patrick Hardy Comments  Patrick Hardy with poor tolerance to treatment today. Patrick Hardy received laying on his side in intense pain. Patrick Hardy adamant that he cant move or walk today due to pain. Verbally reviewed back precautions with Patrick Hardy which Patrick Hardy demonstrated good recall. Educated Patrick Hardy on need for WC to safely DC home today if he was not going to ambulate however Patrick Hardy declined WC transfer training due to intense pain. RN and MD notified. No change in DC/DME recs at this time. Patrick Hardy will continue to follow.     If plan is discharge home, recommend the following: Assist for transportation;Help with stairs or ramp for entrance;A little help with bathing/dressing/bathroom;A lot of help with walking and/or transfers   Can travel by private vehicle        Equipment Recommendations  None recommended by Patrick Hardy (Has needed DME at home)    Recommendations for Other Services       Precautions / Restrictions Precautions Precautions: Back;Fall Precaution Booklet Issued: No (Verbally reviewed) Recall of Precautions/Restrictions: Intact Precaution/Restrictions Comments: no brace needed Restrictions Weight Bearing Restrictions Per Provider Order: No     Mobility  Bed Mobility Overal bed mobility: Needs Assistance Bed Mobility: Rolling Rolling: Contact guard assist         General bed mobility comments: Patrick Hardy received laying on his side in intense pain. Patrick Hardy adamant that he cant move or walk today due to pain. Verbally reviewed back precautions with Patrick Hardy which Patrick Hardy demonstrated good recall. Educated Patrick Hardy on need for WC to safely DC home today if he was not going to ambulate however Patrick Hardy declined WC transfer training due to intense pain.  RN notified.    Transfers                   General transfer comment: Declined due to pain    Ambulation/Gait               General Gait Details: Unable due to pain level   Stairs             Wheelchair Mobility     Tilt Bed    Modified Rankin (Stroke Patients Only)       Balance                                            Communication Communication Communication: No apparent difficulties  Cognition Arousal: Alert Behavior During Therapy: WFL for tasks assessed/performed                             Following commands: Intact      Cueing Cueing Techniques: Verbal cues, Gestural cues, Tactile cues  Exercises      General Comments General comments (skin integrity, edema, etc.): Education on need for WC, safety, prec      Pertinent Vitals/Pain Pain Assessment Pain Assessment: Faces Faces Pain Scale: Hurts worst Pain Location: back, incision site, R flank/side Pain Descriptors / Indicators: Discomfort, Grimacing, Guarding, Sharp Pain Intervention(s): Limited activity within patient's tolerance, Monitored during  session, Patient requesting pain meds-RN notified    Home Living                          Prior Function            Patrick Hardy Goals (current goals can now be found in the care plan section) Progress towards Patrick Hardy goals: Not progressing toward goals - comment (Limited by pain)    Frequency    Min 3X/week      Patrick Hardy Plan      Co-evaluation              AM-PAC Patrick Hardy 6 Clicks Mobility   Outcome Measure  Help needed turning from your back to your side while in a flat bed without using bedrails?: A Little Help needed moving from lying on your back to sitting on the side of a flat bed without using bedrails?: Total (Due to pain) Help needed moving to and from a bed to a chair (including a wheelchair)?: Total Help needed standing up from a chair using your arms (e.g., wheelchair or bedside  chair)?: Total Help needed to walk in hospital room?: Total Help needed climbing 3-5 steps with a railing? : Total 6 Click Score: 8    End of Session   Activity Tolerance: Patient limited by pain Patient left: in bed;with call bell/phone within reach;with bed alarm set Nurse Communication: Mobility status;Patient requests pain meds Patrick Hardy Visit Diagnosis: Unsteadiness on feet (R26.81);Other abnormalities of gait and mobility (R26.89);Pain     Time: 8941-8891 Patrick Hardy Time Calculation (min) (ACUTE ONLY): 10 min  Charges:    $Therapeutic Activity: 8-22 mins Patrick Hardy General Charges $$ ACUTE Patrick Hardy VISIT: 1 Visit                     Patrick Hardy, Patrick Hardy, Patrick Hardy Acute Rehab Services [phone]    Chassidy Layson 02/15/2024, 11:47 AM

## 2024-02-15 NOTE — Progress Notes (Signed)
 Subjective: Patient reports significant pain in his core and back this morning.  He had similar pain yesterday but it improved by the afternoon.   Objective: Vital signs in last 24 hours: Temp:  [97.8 F (36.6 C)-98.6 F (37 C)] 98.4 F (36.9 C) (12/06 0350) Pulse Rate:  [62-70] 65 (12/06 0757) Resp:  [16-19] 16 (12/06 0757) BP: (135-156)/(43-72) 135/53 (12/06 0757) SpO2:  [95 %-99 %] 97 % (12/06 0757)  Intake/Output from previous day: 12/05 0701 - 12/06 0700 In: 360 [P.O.:360] Out: 1000 [Urine:1000] Intake/Output this shift: Total I/O In: -  Out: 450 [Urine:450]  Lying in bed, appears somewhat uncomfortable.  Full strength in lower extremities.  Incisions clean, dry, intact  Lab Results: Recent Labs    02/13/24 2116  WBC 14.0*  HGB 13.3  HCT 40.6  PLT 357   BMET Recent Labs    02/13/24 2116  CREATININE 0.91    Studies/Results: DG Thoracic Spine 2 View Result Date: 02/13/2024 CLINICAL DATA:  Elective surgery. EXAM: THORACIC SPINE 2 VIEWS COMPARISON:  None Available. FINDINGS: Two fluoroscopic spot views of the lower thoracic and upper lumbar spine submitted from the operating room. Posterior rod and pedicle screw fixation spanning 3 levels, difficult to delineate due to coned views. Fluoroscopy time 178.6 seconds. Dose 113.06 mGy. IMPRESSION: Intraoperative fluoroscopy during thoracolumbar surgery. Electronically Signed   By: Andrea Gasman M.D.   On: 02/13/2024 17:51   DG C-Arm 1-60 Min-No Report Result Date: 02/13/2024 Fluoroscopy was utilized by the requesting physician.  No radiographic interpretation.   DG C-Arm 1-60 Min-No Report Result Date: 02/13/2024 Fluoroscopy was utilized by the requesting physician.  No radiographic interpretation.    Assessment/Plan: Status post T10-12 ORIF - PT OT today.  Possible discharge in afternoon.   Patrick Hardy Ned 02/15/2024, 10:41 AM

## 2024-02-16 NOTE — Plan of Care (Signed)
  Problem: Pain Managment: Goal: General experience of comfort will improve and/or be controlled Outcome: Progressing   Problem: Safety: Goal: Ability to remain free from injury will improve Outcome: Progressing   Problem: Skin Integrity: Goal: Risk for impaired skin integrity will decrease Outcome: Progressing

## 2024-02-16 NOTE — Plan of Care (Signed)
   Problem: Activity: Goal: Risk for activity intolerance will decrease Outcome: Not Progressing

## 2024-02-16 NOTE — Progress Notes (Signed)
 Physical Therapy Treatment Patient Details Name: Patrick Hardy MRN: [id number] DOB: 1960-05-10 Today's Date: 02/16/2024   History of Present Illness 63 y/o M admitted to [facility] 12/4 for ORIF with percutaneous pedicle screw placement from T10, and T12. PMHx: s/p 11/24 off roof with T11 fx, COPD, DM, HTN, OSA on CPAP.    PT Comments  He is doing better than yesterday; able to get up and walk to the bathroom with RW; but not walking household distances, and only tolerating about 3-4 minutes in upright standing before needing to not just sit, but must lay back to sidelying in the bed due to pain; Pt and daughter are worried about his ability to tolerate the 30 minute ride home, which is not unreasonable; going now could result in him bouncing back to the ED; If the addition of flexeril  was helpful over the past 24 hours, perhaps another night inpatient would help in dialing in his pain and spasm medication regimen, and he could dc tomorrow more confidently; And, if you press me asking if staying tonight will do anything to help him be more functional my answer will be maybe at worst and probably at best;   I did ask for him to promise if he stays tonight, he will eat a full dinner (not just crackers) in the recliner (doesn't matter to me how much the recliner is reclined), and the same for breakfast tomorrow     If plan is discharge home, recommend the following: Assist for transportation;Help with stairs or ramp for entrance;A little help with bathing/dressing/bathroom;A lot of help with walking and/or transfers   Can travel by private vehicle        Equipment Recommendations  None recommended by PT (Has needed DME at home)    Recommendations for Other Services       Precautions / Restrictions Precautions Precautions: Back;Fall Precaution Booklet Issued: No (Verbally reviewed) Recall of Precautions/Restrictions: Intact Precaution/Restrictions Comments: no brace needed Restrictions Weight  Bearing Restrictions Per Provider Order: No     Mobility  Bed Mobility Overal bed mobility: Needs Assistance Bed Mobility: Rolling Rolling: Contact guard assist Sidelying to sit: Used rails, Min assist, Contact guard assist     Sit to sidelying: Supervision General bed mobility comments: Used rail to push up to sitting; min assist to help LEs off of EOB; CGA to push up to sitting; able to get LEs back into bed to lay down without physical assist    Transfers Overall transfer level: Needs assistance Equipment used: Rolling walker (2 wheels) Transfers: Sit to/from Stand Sit to Stand: Contact guard assist           General transfer comment: stood form elelvated bed (to approximate home)    Ambulation/Gait Ambulation/Gait assistance: Contact guard assist Gait Distance (Feet): 20 Feet (to/from bathroom) Assistive device: Rolling walker (2 wheels) Gait Pattern/deviations: Step-through pattern, Decreased stride length, Wide base of support       General Gait Details: Tolerated standing/upright activity/walking in room approx 3-5 minutes before needing to stop and lie down   Stairs         General stair comments: has a ramped entrance he can use   Wheelchair Mobility     Tilt Bed    Modified Rankin (Stroke Patients Only)       Balance     Sitting balance-Leahy Scale: Fair       Standing balance-Leahy Scale: Fair  Communication Communication Communication: No apparent difficulties  Cognition Arousal: Alert Behavior During Therapy: WFL for tasks assessed/performed   PT - Cognitive impairments: No apparent impairments                         Following commands: Intact      Cueing Cueing Techniques: Verbal cues, Gestural cues, Tactile cues  Exercises      General Comments General comments (skin integrity, edema, etc.): Daughter present and helpful; voiced concern re: pt's ability to tolerate the  ride home      Pertinent Vitals/Pain Pain Assessment Pain Assessment: 0-10 Pain Score: 8  Faces Pain Scale: Hurts whole lot Pain Location: back, incision site, R flank/side; band-like spasm from back around to front Pain Descriptors / Indicators: Discomfort, Grimacing, Guarding, Sharp Pain Intervention(s): Limited activity within patient's tolerance, Premedicated before session    Home Living                          Prior Function            PT Goals (current goals can now be found in the care plan section) Acute Rehab PT Goals Patient Stated Goal: to be able to move better PT Goal Formulation: With patient Time For Goal Achievement: 02/28/24 Potential to Achieve Goals: Good Progress towards PT goals: Progressing toward goals (slowly)    Frequency    Min 3X/week      PT Plan      Co-evaluation              AM-PAC PT 6 Clicks Mobility   Outcome Measure  Help needed turning from your back to your side while in a flat bed without using bedrails?: A Little Help needed moving from lying on your back to sitting on the side of a flat bed without using bedrails?: A Little Help needed moving to and from a bed to a chair (including a wheelchair)?: A Little Help needed standing up from a chair using your arms (e.g., wheelchair or bedside chair)?: A Little Help needed to walk in hospital room?: A Little Help needed climbing 3-5 steps with a railing? : A Lot 6 Click Score: 17    End of Session Equipment Utilized During Treatment: Gait belt Activity Tolerance: Patient limited by pain Patient left: in bed;with call bell/phone within reach Nurse Communication: Mobility status;Patient requests pain meds (antiemetic meds) PT Visit Diagnosis: Unsteadiness on feet (R26.81);Other abnormalities of gait and mobility (R26.89);Pain Pain - part of body:  (Back and band-like spasm lower trunk)     Time: 8553-8484 PT Time Calculation (min) (ACUTE ONLY): 29  min  Charges:    $Gait Training: 8-22 mins $Therapeutic Activity: 8-22 mins PT General Charges $$ ACUTE PT VISIT: 1 Visit                     Silvano Currier, PT  Acute Rehabilitation Services Office [phone] Secure Chat welcomed    Silvano VEAR Currier 02/16/2024, 4:32 PM

## 2024-02-16 NOTE — Progress Notes (Signed)
 Subjective: Patient wanted to be discharged yesterday.  However, physical therapy reported that he could hardly even roll over in bed.  He wanted to see further progress before discharge.  This morning he says his back pain is better but he has not gotten up with therapy yet.  Urinating okay.  Eating okay.  Passing gas   Objective: Vital signs in last 24 hours: Temp:  [98 F (36.7 C)-98.7 F (37.1 C)] 98 F (36.7 C) (12/07 0748) Pulse Rate:  [71-79] 72 (12/07 0748) Resp:  [18-20] 18 (12/07 0748) BP: (94-128)/(42-60) 108/50 (12/07 0748) SpO2:  [95 %-96 %] 96 % (12/07 0748)  Intake/Output from previous day: 12/06 0701 - 12/07 0700 In: 480 [P.O.:480] Out: 1800 [Urine:1800] Intake/Output this shift: No intake/output data recorded.  MAEx4 Incisions clean, dry, intact  Lab Results: Recent Labs    02/13/24 2116  WBC 14.0*  HGB 13.3  HCT 40.6  PLT 357   BMET Recent Labs    02/13/24 2116  CREATININE 0.91    Studies/Results: No results found.   Assessment/Plan: Status post T10-12 ORIF Will discharge this afternoon if cleared by therapy.  Pain prescription have already been sent   Patrick Hardy 02/16/2024, 10:53 AM

## 2024-02-17 MED ORDER — DIAZEPAM 5 MG PO TABS
5.0000 mg | ORAL_TABLET | Freq: Four times a day (QID) | ORAL | Status: DC | PRN
  Administered 2024-02-17 – 2024-02-18 (×4): 5 mg via ORAL
  Filled 2024-02-17 (×3): qty 1

## 2024-02-17 NOTE — Progress Notes (Signed)
 Occupational Therapy Treatment Patient Details Name: Patrick Hardy MRN: [id number] DOB: 12/16/1960 Today's Date: 02/17/2024   History of present illness 63 y/o M admitted to [facility] 12/4 for ORIF with percutaneous pedicle screw placement from T10, and T12. PMHx: s/p 11/24 off roof with T11 fx, COPD, DM, HTN, OSA on CPAP.   OT comments  Patient demonstrating gains with bed mobility, transfers, and standing at sink for hand hygiene. Patient limited by pain and required rest in bed before continuing session. Patient transferred to recliner and was provided education and demonstration on AE use but could only tolerate sitting up to return demonstration with doffing/donning socks with AE.  Discharge recommendations continue to be appropriate.  Acute OT to continue to follow to address established goals.       If plan is discharge home, recommend the following:  A little help with walking and/or transfers;A lot of help with bathing/dressing/bathroom;Assistance with cooking/housework;Assist for transportation;Help with stairs or ramp for entrance   Equipment Recommendations  None recommended by OT    Recommendations for Other Services      Precautions / Restrictions Precautions Precautions: Back;Fall Precaution Booklet Issued:  (reviewed back precautions) Recall of Precautions/Restrictions: Intact Precaution/Restrictions Comments: no brace needed Restrictions Weight Bearing Restrictions Per Provider Order: No       Mobility Bed Mobility Overal bed mobility: Needs Assistance Bed Mobility: Rolling, Sidelying to Sit, Sit to Supine Rolling: Supervision Sidelying to sit: Supervision     Sit to sidelying: Supervision General bed mobility comments: Supervision for bed mobility with good logroll.    Transfers Overall transfer level: Needs assistance Equipment used: Rolling walker (2 wheels) Transfers: Sit to/from Stand, Bed to chair/wheelchair/BSC Sit to Stand: Supervision     Step  pivot transfers: Supervision     General transfer comment: Good hand placement. Able to step pivot to recliner and back with supervision. Able to tolerate sitting in chair for about 5 minutes before pain was too limiting.     Balance Overall balance assessment: Needs assistance Sitting-balance support: Bilateral upper extremity supported, Feet supported Sitting balance-Leahy Scale: Fair Sitting balance - Comments: EOB   Standing balance support: No upper extremity supported Standing balance-Leahy Scale: Fair Standing balance comment: stood at sink with support from sink while performing hand hygiene                           ADL either performed or assessed with clinical judgement   ADL Overall ADL's : Needs assistance/impaired     Grooming: Wash/dry hands;Wash/dry face;Minimal assistance;Standing Grooming Details (indicate cue type and reason): declined oral care             Lower Body Dressing: Moderate assistance;With adaptive equipment;Sit to/from stand Lower Body Dressing Details (indicate cue type and reason): education on dressing stick to doff socks and sock aide to donn socks with patient able to return demonstration. Patient could not tolerance OOB to address donning pants with reacher but demonstration was provided Toilet Transfer: Contact guard assist Toilet Transfer Details (indicate cue type and reason): stood at toilet to void with CGA for balance           General ADL Comments: limited due to pain    Extremity/Trunk Assessment              Vision       Perception     Praxis     Communication Communication Communication: No apparent difficulties   Cognition Arousal: Alert  Behavior During Therapy: WFL for tasks assessed/performed Cognition: No apparent impairments                               Following commands: Intact        Cueing   Cueing Techniques: Verbal cues, Gestural cues, Tactile cues  Exercises       Shoulder Instructions       General Comments education on AE and how to purchase provided    Pertinent Vitals/ Pain       Pain Assessment Pain Assessment: Faces Faces Pain Scale: Hurts whole lot Pain Location: back, incision site, R flank/side; band-like spasm from back around to front Pain Descriptors / Indicators: Discomfort, Grimacing, Guarding, Sharp Pain Intervention(s): Limited activity within patient's tolerance, Monitored during session, Premedicated before session, Repositioned  Home Living                                          Prior Functioning/Environment              Frequency  Min 2X/week        Progress Toward Goals  OT Goals(current goals can now be found in the care plan section)  Progress towards OT goals: Progressing toward goals  Acute Rehab OT Goals Patient Stated Goal: less pain OT Goal Formulation: With patient Time For Goal Achievement: 02/28/24 Potential to Achieve Goals: Good ADL Goals Pt Will Perform Grooming: with supervision;standing Pt Will Perform Lower Body Bathing: with supervision;with adaptive equipment;sit to/from stand;sitting/lateral leans Pt Will Perform Lower Body Dressing: with supervision;with adaptive equipment;sitting/lateral leans;sit to/from stand Pt Will Transfer to Toilet: with supervision;ambulating;regular height toilet;bedside commode;grab bars Pt Will Perform Toileting - Clothing Manipulation and hygiene: with supervision;sitting/lateral leans;sit to/from stand Additional ADL Goal #1: Pt will recall 3/3 spinal precautions during ADLs and functional mobility 100% of the time.  Plan      Co-evaluation    PT/OT/SLP Co-Evaluation/Treatment: Yes Reason for Co-Treatment: Other (comment);To address functional/ADL transfers (limited activity tolerance) PT goals addressed during session: Mobility/safety with mobility OT goals addressed during session: ADL's and self-care      AM-PAC OT 6  Clicks Daily Activity     Outcome Measure   Help from another person eating meals?: None Help from another person taking care of personal grooming?: A Lot Help from another person toileting, which includes using toliet, bedpan, or urinal?: A Lot Help from another person bathing (including washing, rinsing, drying)?: A Lot Help from another person to put on and taking off regular upper body clothing?: A Little Help from another person to put on and taking off regular lower body clothing?: A Lot 6 Click Score: 15    End of Session Equipment Utilized During Treatment: Gait belt;Rolling walker (2 wheels);Other (comment) (AE)  OT Visit Diagnosis: Other abnormalities of gait and mobility (R26.89);Pain Pain - part of body:  (back)   Activity Tolerance Patient limited by pain   Patient Left in bed;with call bell/phone within reach;with bed alarm set   Nurse Communication Mobility status        Time: 8981-8956 OT Time Calculation (min): 25 min  Charges: OT General Charges $OT Visit: 1 Visit OT Treatments $Self Care/Home Management : 8-22 mins  Dick Laine, OTA Acute Rehabilitation Services  Office [phone]   Patrick Hardy Laine 02/17/2024, 1:34 PM

## 2024-02-17 NOTE — TOC Progression Note (Signed)
 Transition of Care [facility]) - Progression Note    Patient Details  Name: Patrick Hardy MRN: [id number] Date of Birth: 10-Jun-1960  [facility] CM/SW Contact  Andrez JULIANNA George, RN Phone Number: 02/17/2024, 2:06 PM  Clinical Narrative:     Plan is for home when medically ready. CM continues to follow.   Expected Discharge Plan: Home/Self Care                 Expected Discharge Plan and Services         Expected Discharge Date: 02/15/24                                     Social Drivers of Health (SDOH) Interventions SDOH Screenings   Food Insecurity: No Food Insecurity (02/14/2024)  Housing: Low Risk  (02/14/2024)  Transportation Needs: No Transportation Needs (02/14/2024)  Utilities: Not At Risk (02/14/2024)  Tobacco Use: Medium Risk (02/13/2024)    Readmission Risk Interventions     No data to display

## 2024-02-17 NOTE — Progress Notes (Signed)
 Physical Therapy Treatment Patient Details Name: Patrick Hardy MRN: [id number] DOB: [birth year] Today's Date: 02/17/2024   History of Present Illness 63 y/o M admitted to [facility] 12/4 for ORIF with percutaneous pedicle screw placement from T10, and T12. PMHx: s/p 11/24 off roof with T11 fx, COPD, DM, HTN, OSA on CPAP.    PT Comments  Pt tolerated treatment well today. Co-treat with OT. Pt today demonstrated significant improvement in mobility today compared to previous session on Saturday however remains very limited by pain and endurance. Pt today was able to ambulate to bathroom and back as well as sit in the chair however pt currently only able to tolerate ~5 minutes at a time. Based off of today with pt pain and endurance levels, pt is not quite ready to safely DC home especially since pt has a 30 minute drive back home however anticipate that pt should be to DC home safely in the next day or two. No change in DC/DME recs at this time. PT will continue to follow.     If plan is discharge home, recommend the following: Assist for transportation;Help with stairs or ramp for entrance;A little help with bathing/dressing/bathroom;A lot of help with walking and/or transfers   Can travel by private vehicle        Equipment Recommendations  None recommended by PT (Has needed DME at home)    Recommendations for Other Services       Precautions / Restrictions Precautions Precautions: Back;Fall Precaution Booklet Issued: No (Verbally reviewed) Recall of Precautions/Restrictions: Intact Precaution/Restrictions Comments: no brace needed Restrictions Weight Bearing Restrictions Per Provider Order: No     Mobility  Bed Mobility Overal bed mobility: Needs Assistance Bed Mobility: Rolling, Sidelying to Sit, Sit to Supine Rolling: Supervision Sidelying to sit: Supervision     Sit to sidelying: Supervision General bed mobility comments: Supervision for bed mobility with good logroll.     Transfers Overall transfer level: Needs assistance Equipment used: Rolling walker (2 wheels) Transfers: Sit to/from Stand, Bed to chair/wheelchair/BSC Sit to Stand: Supervision   Step pivot transfers: Supervision       General transfer comment: Good hand placement. Able to step pivot to recliner and back with supervision. Able to tolerate sitting in chair for about 5 minutes before pain was too limiting.    Ambulation/Gait Ambulation/Gait assistance: Supervision Gait Distance (Feet): 20 Feet Assistive device: Rolling walker (2 wheels) Gait Pattern/deviations: Step-through pattern, Decreased stride length, Wide base of support Gait velocity: decr     General Gait Details: Tolerated standing/upright activity/walking in room approx 3-5 minutes before needing to stop and lie down   Stairs         General stair comments: has a ramped entrance he can use   Wheelchair Mobility     Tilt Bed    Modified Rankin (Stroke Patients Only)       Balance     Sitting balance-Leahy Scale: Fair       Standing balance-Leahy Scale: Fair                              Hotel Manager: No apparent difficulties  Cognition Arousal: Alert Behavior During Therapy: WFL for tasks assessed/performed   PT - Cognitive impairments: No apparent impairments                         Following commands: Intact      Cueing  Cueing Techniques: Verbal cues, Gestural cues, Tactile cues  Exercises      General Comments General comments (skin integrity, edema, etc.): VSS. PT and OT provided extensive education on safety at home.      Pertinent Vitals/Pain Pain Assessment Pain Assessment: Faces Faces Pain Scale: Hurts whole lot Pain Location: back, incision site, R flank/side; band-like spasm from back around to front Pain Descriptors / Indicators: Discomfort, Grimacing, Guarding, Sharp Pain Intervention(s): Limited activity within  patient's tolerance, Monitored during session, Repositioned    Home Living                          Prior Function            PT Goals (current goals can now be found in the care plan section) Acute Rehab PT Goals Patient Stated Goal: to be able to move better PT Goal Formulation: With patient Time For Goal Achievement: 02/28/24 Potential to Achieve Goals: Good Progress towards PT goals: Progressing toward goals    Frequency    Min 3X/week      PT Plan      Co-evaluation              AM-PAC PT 6 Clicks Mobility   Outcome Measure  Help needed turning from your back to your side while in a flat bed without using bedrails?: A Little Help needed moving from lying on your back to sitting on the side of a flat bed without using bedrails?: A Little Help needed moving to and from a bed to a chair (including a wheelchair)?: A Little Help needed standing up from a chair using your arms (e.g., wheelchair or bedside chair)?: A Little Help needed to walk in hospital room?: A Little Help needed climbing 3-5 steps with a railing? : A Lot 6 Click Score: 17    End of Session Equipment Utilized During Treatment: Gait belt Activity Tolerance: Patient limited by pain Patient left: in bed;with call bell/phone within reach Nurse Communication: Mobility status;Patient requests pain meds (antiemetic meds) PT Visit Diagnosis: Unsteadiness on feet (R26.81);Other abnormalities of gait and mobility (R26.89);Pain Pain - part of body:  (Back and band-like spasm lower trunk)     Time: 8981-8956 PT Time Calculation (min) (ACUTE ONLY): 25 min  Charges:    $Gait Training: 8-22 mins PT General Charges $$ ACUTE PT VISIT: 1 Visit                     Sueellen NOVAK, PT, DPT Acute Rehab Services [phone]    Egor Fullilove 02/17/2024, 12:16 PM

## 2024-02-17 NOTE — Progress Notes (Signed)
 Patient ID: Patrick Hardy, male   DOB: [birth year], 63 y.o.   MRN: [id number] BP (!) 117/40 (BP Location: Left Arm)   Pulse 76   Temp 98.7 F (37.1 C)   Resp 19   Ht 6' 2 (1.88 m)   Wt 128.8 kg   SpO2 93%   BMI 36.46 kg/m  Alert and oriented x 4, moving all extremities well Complaining of severe spasms in back and abdomen Wounds are clean, dry, without signs of infection.  Continue with PT

## 2024-02-18 ENCOUNTER — Inpatient Hospital Stay (HOSPITAL_COMMUNITY)

## 2024-02-18 NOTE — Plan of Care (Signed)
  Problem: Safety: Goal: Ability to remain free from injury will improve Outcome: Progressing   Problem: Skin Integrity: Goal: Risk for impaired skin integrity will decrease Outcome: Progressing   Problem: Activity: Goal: Risk for activity intolerance will decrease Outcome: Not Progressing   Problem: Pain Managment: Goal: General experience of comfort will improve and/or be controlled Outcome: Not Progressing

## 2024-02-18 NOTE — Progress Notes (Signed)
 Physical Therapy Treatment Patient Details Name: Patrick Hardy MRN: [id number] DOB: 09-Oct-1960 Today's Date: 02/18/2024   History of Present Illness 63 y/o M admitted to [facility] 12/4 for ORIF with percutaneous pedicle screw placement from T10, and T12. PMHx: s/p 11/24 off roof with T11 fx, COPD, DM, HTN, OSA on CPAP.    PT Comments  Pt tolerated treatment well today. Pt with significant progress with mobility, endurance, and pain level today compared to previous session. Pt today was able to ambulate around the unit and states that he was able to sit up in the recliner for ~20 minutes. Pt is now safe to DC home from a PT perspective. No change in DC/DME recs at this time. PT will continue to follow.     If plan is discharge home, recommend the following: Assist for transportation;Help with stairs or ramp for entrance;A little help with bathing/dressing/bathroom;A lot of help with walking and/or transfers   Can travel by private vehicle        Equipment Recommendations  None recommended by PT    Recommendations for Other Services       Precautions / Restrictions Precautions Precautions: Back;Fall Precaution Booklet Issued:  (reviewed back precautions) Recall of Precautions/Restrictions: Intact Precaution/Restrictions Comments: no brace needed Restrictions Weight Bearing Restrictions Per Provider Order: No     Mobility  Bed Mobility Overal bed mobility: Needs Assistance Bed Mobility: Rolling, Sidelying to Sit, Sit to Supine Rolling: Supervision Sidelying to sit: Supervision     Sit to sidelying: Supervision General bed mobility comments: Supervision for bed mobility with good logroll.    Transfers Overall transfer level: Needs assistance Equipment used: Rolling walker (2 wheels) Transfers: Sit to/from Stand Sit to Stand: Supervision           General transfer comment: Good hand placement. Able to step pivot to recliner and back with supervision. Able to tolerate  sitting in chair for about 5 minutes before pain was too limiting.    Ambulation/Gait Ambulation/Gait assistance: Supervision Gait Distance (Feet): 350 Feet Assistive device: Rolling walker (2 wheels) Gait Pattern/deviations: Step-through pattern, Decreased stride length, Wide base of support Gait velocity: decr     General Gait Details: Pt able to ambulate in hallway around unit with supervision. no LOB noted. Cues for proximity to RW.   Stairs             Wheelchair Mobility     Tilt Bed    Modified Rankin (Stroke Patients Only)       Balance Overall balance assessment: Needs assistance Sitting-balance support: Bilateral upper extremity supported, Feet supported Sitting balance-Leahy Scale: Fair Sitting balance - Comments: EOB   Standing balance support: No upper extremity supported Standing balance-Leahy Scale: Fair Standing balance comment: stood at sink with support from sink while performing hand hygiene                            Communication Communication Communication: No apparent difficulties  Cognition Arousal: Alert Behavior During Therapy: WFL for tasks assessed/performed                             Following commands: Intact      Cueing Cueing Techniques: Verbal cues, Gestural cues, Tactile cues  Exercises      General Comments General comments (skin integrity, edema, etc.): Pt with good recall on back precautions      Pertinent Vitals/Pain Pain Assessment  Pain Assessment: Faces Faces Pain Scale: Hurts a little bit Pain Location: back, incision site, R flank/side; band-like spasm from back around to front Pain Descriptors / Indicators: Discomfort, Grimacing, Guarding, Sharp Pain Intervention(s): Limited activity within patient's tolerance, Monitored during session    Home Living                          Prior Function            PT Goals (current goals can now be found in the care plan  section) Progress towards PT goals: Progressing toward goals    Frequency    Min 3X/week      PT Plan      Co-evaluation              AM-PAC PT 6 Clicks Mobility   Outcome Measure  Help needed turning from your back to your side while in a flat bed without using bedrails?: A Little Help needed moving from lying on your back to sitting on the side of a flat bed without using bedrails?: A Little Help needed moving to and from a bed to a chair (including a wheelchair)?: A Little Help needed standing up from a chair using your arms (e.g., wheelchair or bedside chair)?: A Little Help needed to walk in hospital room?: A Little Help needed climbing 3-5 steps with a railing? : A Lot 6 Click Score: 17    End of Session Equipment Utilized During Treatment: Gait belt Activity Tolerance: Patient tolerated treatment well Patient left: in chair;with call bell/phone within reach;with chair alarm set Nurse Communication: Mobility status;Patient requests pain meds PT Visit Diagnosis: Unsteadiness on feet (R26.81);Other abnormalities of gait and mobility (R26.89);Pain     Time: 8957-8940 PT Time Calculation (min) (ACUTE ONLY): 17 min  Charges:    $Gait Training: 8-22 mins PT General Charges $$ ACUTE PT VISIT: 1 Visit                     Sueellen NOVAK, PT, DPT Acute Rehab Services [phone]    Angelee Bahr 02/18/2024, 12:28 PM

## 2024-02-18 NOTE — Progress Notes (Signed)
 Patient ID: Patrick Hardy, male   DOB: 07-10-1960, 63 y.o.   MRN: [id number] BP (!) 130/41 (BP Location: Right Arm)   Pulse 72   Temp 98.1 F (36.7 C) (Oral)   Resp 17   Ht 6' 2 (1.88 m)   Wt 128.8 kg   SpO2 95%   BMI 36.46 kg/m  Alert, oriented x 4. Speech is clear and fluent. Moving all extremities well. 5/5 strength.  Did better today with walking. Wounds are clean dry, no signs of infection.  CT shows screws in good placement,  right T10 screw does have bone medial to it, no hematoma in the canal. And his symptoms of the spasms are bilateral. No obvious reason for the spasms.  Will see how he does tomorrow.
